# Patient Record
Sex: Female | Born: 1955 | Race: White | Hispanic: No | Smoking: Never smoker
Health system: Southern US, Community
[De-identification: ages and names within clinical notes are randomized; demographics above are authoritative.]

## PROBLEM LIST (undated history)

## (undated) DIAGNOSIS — K449 Diaphragmatic hernia without obstruction or gangrene: Secondary | ICD-10-CM

## (undated) HISTORY — PX: TONSILLECTOMY: SUR1361

## (undated) HISTORY — PX: ABDOMINAL HYSTERECTOMY: SHX81

## (undated) HISTORY — PX: APPENDECTOMY: SHX54

---

## 2003-02-20 ENCOUNTER — Other Ambulatory Visit: Payer: Self-pay

## 2003-12-21 ENCOUNTER — Emergency Department: Payer: Self-pay | Admitting: Emergency Medicine

## 2004-12-31 ENCOUNTER — Other Ambulatory Visit: Payer: Self-pay

## 2004-12-31 ENCOUNTER — Emergency Department: Payer: Self-pay | Admitting: Emergency Medicine

## 2005-06-27 ENCOUNTER — Ambulatory Visit: Payer: Self-pay | Admitting: Unknown Physician Specialty

## 2006-02-03 ENCOUNTER — Emergency Department: Payer: Self-pay | Admitting: Emergency Medicine

## 2006-12-07 ENCOUNTER — Ambulatory Visit: Payer: Self-pay

## 2006-12-21 ENCOUNTER — Ambulatory Visit: Payer: Self-pay | Admitting: Gastroenterology

## 2007-09-09 ENCOUNTER — Emergency Department (HOSPITAL_COMMUNITY): Admission: EM | Admit: 2007-09-09 | Discharge: 2007-09-09 | Payer: Self-pay | Admitting: Emergency Medicine

## 2008-01-08 ENCOUNTER — Ambulatory Visit: Payer: Self-pay

## 2009-05-07 ENCOUNTER — Emergency Department: Payer: Self-pay | Admitting: Emergency Medicine

## 2009-06-09 ENCOUNTER — Ambulatory Visit: Payer: Self-pay | Admitting: Gastroenterology

## 2011-11-01 ENCOUNTER — Encounter (HOSPITAL_COMMUNITY): Payer: Self-pay | Admitting: *Deleted

## 2011-11-01 ENCOUNTER — Emergency Department (HOSPITAL_COMMUNITY)
Admission: EM | Admit: 2011-11-01 | Discharge: 2011-11-01 | Disposition: A | Discharge status: Home / Self Care | Payer: Self-pay | Attending: Emergency Medicine | Admitting: Emergency Medicine

## 2011-11-01 DIAGNOSIS — M545 Low back pain, unspecified: Secondary | ICD-10-CM | POA: Insufficient documentation

## 2011-11-01 LAB — CBC WITH DIFFERENTIAL/PLATELET
Basophils Absolute: 0 10*3/uL (ref 0.0–0.1)
Basophils Relative: 0 % (ref 0–1)
Eosinophils Absolute: 0.4 10*3/uL (ref 0.0–0.7)
HCT: 42.6 % (ref 36.0–46.0)
MCH: 30.2 pg (ref 26.0–34.0)
MCHC: 34 g/dL (ref 30.0–36.0)
Monocytes Absolute: 0.5 10*3/uL (ref 0.1–1.0)
Neutro Abs: 3.9 10*3/uL (ref 1.7–7.7)
Neutrophils Relative %: 57 % (ref 43–77)
RDW: 12.8 % (ref 11.5–15.5)

## 2011-11-01 LAB — COMPREHENSIVE METABOLIC PANEL
AST: 22 U/L (ref 0–37)
Albumin: 4.1 g/dL (ref 3.5–5.2)
Calcium: 9.6 mg/dL (ref 8.4–10.5)
Chloride: 105 mEq/L (ref 96–112)
Creatinine, Ser: 0.8 mg/dL (ref 0.50–1.10)
Total Bilirubin: 0.3 mg/dL (ref 0.3–1.2)
Total Protein: 7.4 g/dL (ref 6.0–8.3)

## 2011-11-01 LAB — URINALYSIS, ROUTINE W REFLEX MICROSCOPIC
Glucose, UA: NEGATIVE mg/dL
Leukocytes, UA: NEGATIVE
Nitrite: NEGATIVE
Specific Gravity, Urine: 1.017 (ref 1.005–1.030)
pH: 5.5 (ref 5.0–8.0)

## 2011-11-01 LAB — LIPASE, BLOOD: Lipase: 30 U/L (ref 11–59)

## 2011-11-01 MED ORDER — OXYCODONE-ACETAMINOPHEN 5-325 MG PO TABS: ORAL_TABLET | ORAL | Status: DC

## 2011-11-01 MED ORDER — KETOROLAC TROMETHAMINE 60 MG/2ML IM SOLN
60.0000 mg | Freq: Once | INTRAMUSCULAR | Status: AC
  Administered 2011-11-01: 60 mg via INTRAMUSCULAR
  Filled 2011-11-01: qty 2

## 2011-11-01 NOTE — ED Notes (Signed)
Pt. A.O. X 4. NAD. Vitals stable. Pain decreased. Given prescriptions. Verbalized understanding in mediations administration and need to follow up. Verbalized understanding of need to return to the ED. No further questions at this time.

## 2011-11-01 NOTE — ED Provider Notes (Signed)
History     CSN: [id number]  Arrival date & time 11/01/11  1751   First MD Initiated Contact with Patient 11/01/11 1915      Chief Complaint  Patient presents with  . Flank Pain    (Consider location/radiation/quality/duration/timing/severity/associated sxs/prior treatment) HPI  56 y.o. female in no acute distress complaining of bilateral lower back pain radiating around to the hip area starting yesterday and significantly worsening today. Pain is 10 out of 10, exacerbated by weightbearing and crossing her legs. It is associated with nausea and diarrhea. Patient denies any fever, history of IV drug abuse or cancer, vomiting, weakness, change in bladder habits, recent trauma, abdominal pain, chest pain shortness of breath.  History reviewed. No pertinent past medical history.  History reviewed. No pertinent past surgical history.  No family history on file.  History  Substance Use Topics  . Smoking status: Never Smoker   . Smokeless tobacco: Not on file  . Alcohol Use: No    OB History    Grav Para Term Preterm Abortions TAB SAB Ect Mult Living                  Review of Systems  Constitutional: Negative for fever.  Respiratory: Negative for shortness of breath.   Cardiovascular: Negative for chest pain.  Gastrointestinal: Positive for diarrhea. Negative for nausea, vomiting and abdominal pain.  Genitourinary: Negative for dysuria, urgency and flank pain.  Musculoskeletal: Positive for back pain.  All other systems reviewed and are negative.    Allergies  Review of patient's allergies indicates no known allergies.  Home Medications   Current Outpatient Rx  Name Route Sig Dispense Refill  . ADULT MULTIVITAMIN W/MINERALS CH Oral Take 1 tablet by mouth daily.      BP 116/60  Pulse 88  Temp 98.1 F (36.7 C) (Oral)  Resp 24  SpO2 97%  Physical Exam  Nursing note and vitals reviewed. Constitutional: She is oriented to person, place, and time. She appears  well-developed and well-nourished. No distress.  HENT:  Head: Normocephalic.  Mouth/Throat: Oropharynx is clear and moist.  Eyes: Conjunctivae normal and EOM are normal. Pupils are equal, round, and reactive to light.  Neck: Normal range of motion.  Cardiovascular: Normal rate, regular rhythm and intact distal pulses.   Pulmonary/Chest: Effort normal and breath sounds normal. No stridor. No respiratory distress. She has no wheezes. She has no rales. She exhibits no tenderness.  Abdominal: Soft. Bowel sounds are normal. She exhibits no distension and no mass. There is no tenderness. There is no rebound and no guarding.  Genitourinary:       CVA tenderness bilaterally  Musculoskeletal: Normal range of motion.       Decreased range of motion in spinal flexion also pain with flexion  Neurological: She is alert and oriented to person, place, and time.       Cranial nerves III through XII intact, strength 5 out of 5x4 extremities, negative pronator drift, finger to nose and heel-to-shin coordinated, sensation intact to pinprick and light touch, gait is coordinated and Romberg is negative.    Skin: Skin is warm.  Psychiatric: She has a normal mood and affect.    ED Course  Procedures (including critical care time)  Labs Reviewed  CBC WITH DIFFERENTIAL - Abnormal; Notable for the following:    Eosinophils Relative 6 (*)     All other components within normal limits  COMPREHENSIVE METABOLIC PANEL - Abnormal; Notable for the following:  GFR calc non Af Amer 81 (*)     All other components within normal limits  URINALYSIS, ROUTINE W REFLEX MICROSCOPIC - Abnormal; Notable for the following:    APPearance CLOUDY (*)     Ketones, ur 15 (*)     All other components within normal limits  LIPASE, BLOOD   No results found.   1. Lumbago       MDM  Patient with bilateral lower back pain exacerbated over the last 24 hours. No red flags. In severe amount of pain, however, Rx options are  limited by the fact the patient will be driving home. I will give her shot of Toradol.  New Prescriptions   OXYCODONE-ACETAMINOPHEN (PERCOCET/ROXICET) 5-325 MG PER TABLET    1 to 2 tabs PO q6hrs  PRN for pain          Wynetta Emery, PA-C 11/01/11 2029

## 2011-11-01 NOTE — ED Notes (Signed)
The pt has had rt flank pain since yesterday.  Nausea with diarrhea

## 2011-11-01 NOTE — ED Notes (Addendum)
Pt reports lower back pain bilaterally. Reports falling on right side approx 4 weeks ago. Pain started yesterday. "Today the pain is so bad it's making me sick on my stomach". Denies burning or pain on urination. Denies N/V. Reports diarrhea x 1 day. Pain 10/10 Radiating to right and left lower abdominal quadrants and buttocks. A.O. X 4. Ambulatory. Respirations even and regular. Vitals stable. NAD.

## 2011-11-02 NOTE — ED Provider Notes (Signed)
Medical screening examination/treatment/procedure(s) were performed by non-physician practitioner and as supervising physician I was immediately available for consultation/collaboration.   Carleene Cooper III, MD 11/02/11 1328

## 2015-02-25 ENCOUNTER — Encounter: Payer: Self-pay | Admitting: *Deleted

## 2015-02-25 ENCOUNTER — Emergency Department: Payer: Self-pay

## 2015-02-25 ENCOUNTER — Emergency Department
Admission: EM | Admit: 2015-02-25 | Discharge: 2015-02-25 | Disposition: A | Discharge status: Home / Self Care | Payer: Self-pay | Attending: Emergency Medicine | Admitting: Emergency Medicine

## 2015-02-25 DIAGNOSIS — M549 Dorsalgia, unspecified: Secondary | ICD-10-CM | POA: Insufficient documentation

## 2015-02-25 DIAGNOSIS — Z79899 Other long term (current) drug therapy: Secondary | ICD-10-CM | POA: Insufficient documentation

## 2015-02-25 DIAGNOSIS — J9811 Atelectasis: Secondary | ICD-10-CM | POA: Insufficient documentation

## 2015-02-25 LAB — POCT RAPID STREP A: STREPTOCOCCUS, GROUP A SCREEN (DIRECT): NEGATIVE

## 2015-02-25 MED ORDER — IBUPROFEN 800 MG PO TABS: 800.0000 mg | ORAL_TABLET | Freq: Three times a day (TID) | ORAL | Status: DC | PRN

## 2015-02-25 MED ORDER — GUAIFENESIN-CODEINE 100-10 MG/5ML PO SOLN: 10.0000 mL | ORAL | Status: DC | PRN

## 2015-02-25 MED ORDER — AZITHROMYCIN 250 MG PO TABS: ORAL_TABLET | ORAL | Status: DC

## 2015-02-25 NOTE — ED Notes (Signed)
Pt reports she has bodyaches, chills, intermittent fevers, and cough.  Sx for 2 days.

## 2015-02-25 NOTE — ED Provider Notes (Signed)
[facility] Emergency Department Provider Note  ____________________________________________  Time seen: Approximately 3:37 PM  I have reviewed the triage vital signs and the nursing notes.   HISTORY  Chief Complaint URI   HPI Kelsey Marquez is a 60 y.o. female who presents to the ER with complaints of "flu" symptoms. Reports fever, chills, chest congestion/tightness, backache and sore throat x 1.5 days. No relief with OTC Goodies or other medication. Reports occasional cough with non-productive sputum.   No past medical history on file.  There are no active problems to display for this patient.   No past surgical history on file.  Current Outpatient Rx  Name  Route  Sig  Dispense  Refill  . azithromycin (ZITHROMAX Z-PAK) 250 MG tablet      Take 2 tablets (500 mg) on  Day 1,  followed by 1 tablet (250 mg) once daily on Days 2 through 5.   6 each   0   . guaiFENesin-codeine 100-10 MG/5ML syrup   Oral   Take 10 mLs by mouth every 4 (four) hours as needed for cough.   180 mL   0   . ibuprofen (ADVIL,MOTRIN) 800 MG tablet   Oral   Take 1 tablet (800 mg total) by mouth every 8 (eight) hours as needed.   30 tablet   0   . Multiple Vitamin (MULTIVITAMIN WITH MINERALS) TABS   Oral   Take 1 tablet by mouth daily.         Marland Kitchen oxyCODONE-acetaminophen (PERCOCET/ROXICET) 5-325 MG per tablet      1 to 2 tabs PO q6hrs  PRN for pain   15 tablet   0     Allergies Review of patient's allergies indicates no known allergies.  No family history on file.  Social History Social History  Substance Use Topics  . Smoking status: Never Smoker   . Smokeless tobacco: None  . Alcohol Use: No    Review of Systems Constitutional: Positive fever/chills Eyes: No visual changes. ENT: Mild sore throat. Cardiovascular: Positive chest pain with tighness. Respiratory: Occasional shortness of breath. Gastrointestinal: No abdominal pain.  No nausea, no vomiting.   No diarrhea.  No constipation. Genitourinary: Negative for dysuria. Musculoskeletal: Positive for back pain. Skin: Negative for rash. Neurological: Negative for headaches, focal weakness or numbness.  10-point ROS otherwise negative.  ____________________________________________   PHYSICAL EXAM:  VITAL SIGNS: ED Triage Vitals  Enc Vitals Group     BP 02/25/15 1506 159/82 mmHg     Pulse Rate 02/25/15 1506 95     Resp 02/25/15 1506 20     Temp 02/25/15 1506 98.4 F (36.9 C)     Temp Source 02/25/15 1506 Oral     SpO2 02/25/15 1506 99 %     Weight 02/25/15 1506 165 lb (74.844 kg)     Height 02/25/15 1506  (1.6 m)     Head Cir --      Peak Flow --      Pain Score 02/25/15 1507 9     Pain Loc --      Pain Edu? --      Excl. in GC? --     Constitutional: Alert and oriented. Well appearing and in no acute distress. Eyes: Conjunctivae are normal. PERRL. EOMI. Head: Atraumatic. Nose: No congestion/rhinnorhea. Mouth/Throat: Mucous membranes are moist.  Oropharynx non-erythematous. Neck: No stridor.   Cardiovascular: Normal rate, regular rhythm. Grossly normal heart sounds.  Good peripheral circulation. Respiratory: Normal respiratory  effort.  No retractions. Lungs CTAB. Musculoskeletal: No lower extremity tenderness nor edema.  No joint effusions. Neurologic:  Normal speech and language. No gross focal neurologic deficits are appreciated. No gait instability. Skin:  Skin is warm, dry and intact. No rash noted. Psychiatric: Mood and affect are normal. Speech and behavior are normal.  ____________________________________________   LABS (all labs ordered are listed, but only abnormal results are displayed)  Labs Reviewed  CULTURE, GROUP A STREP ([facility])  INFLUENZA PANEL BY PCR (TYPE A & B, H1N1)  POCT RAPID STREP A    RADIOLOGY  Atelectasis right middle lobe. ____________________________________________   PROCEDURES  Procedure(s) performed: None  Critical  Care performed: No  ____________________________________________   INITIAL IMPRESSION / ASSESSMENT AND PLAN / ED COURSE  Pertinent labs & imaging results that were available during my care of the patient were reviewed by me and considered in my medical decision making (see chart for details).  Acute upper respiratory infection with atelectasis. Rx given for Zithromax, Motrin 800, Robitussin-AC. Patient follow-up with PCP or return to the ER with any worsening symptomology. Work excuse given 48 hours.  Patient voices no other emergency medical complaints at this time. ____________________________________________   FINAL CLINICAL IMPRESSION(S) / ED DIAGNOSES  Final diagnoses:  Atelectasis, right     Evangeline Dakin, PA-C 02/25/15 1658  Governor Rooks, MD 02/25/15 1745

## 2015-02-25 NOTE — Discharge Instructions (Signed)
Atelectasis, Adult °Atelectasis is a collapse of the small air sacs in the lungs (alveoli). When this occurs, all or part of a lung collapses and becomes airless. It can be caused by various things and is a common problem after surgery. The severity of atelectasis will vary depending on the size of the area involved and the underlying cause of the condition. °CAUSES  °There are multiple causes for atelectasis:  °· Shallow breathing, particularly if there is an injury to your chest wall or abdomen that makes it painful to take a deep breath. This commonly occurs after surgery. °· Obstruction of your airways (bronchi or bronchioles). This may be caused by a buildup of mucus (mucus plug), tumors, blood clots (pulmonary embolus), or inhaled foreign bodies. Mucus plugs occur when the lungs do not expand enough to get rid of mucus. °· Outside pressure on the lung. This may be caused by tumors, fluid (pleural effusion), or a leakage of air between the lung and rib cage (pneumothorax).   °· Infections such as pneumonia. °· Scarring in lung tissue left over from previous infection or injury. °· Some diseases such as cystic fibrosis. °SIGNS AND SYMPTOMS  °Often, atelectasis will have no symptoms. When symptoms occur, they include: °· Shortness of breath.   °· Bluish color to your nails, lips, or mouth (cyanosis). °DIAGNOSIS  °Your health care provider may suspect atelectasis based on symptoms and physical findings. A chest X-ray may be done to confirm the diagnosis. More specialized X-ray exams are sometimes required.  °TREATMENT  °Treatment will depend on the cause of the atelectasis. Treatment may include: °· Purposeful coughing to loosen mucus plugs in the lungs. °· Chest physiotherapy. This consists of clapping or percussion on the chest over the lungs to further loosen mucus plugs. °· Postural drainage techniques. This involves positioning your body so your head is lower than your chest. °HOME CARE  INSTRUCTIONS °· Practice relaxed deep breathing whenever you are sitting down. A good technique is to take a few relaxed deep breaths each time a commercial comes on if you are watching television. °· If you were given a deep breathing device (such as an incentive spirometer) or a mucus clearance device, use this regularly as directed by your health care provider. °· Try to cough several times a day as directed by your health care provider. °· Perform any chest physiotherapy or postural drainage techniques as directed by your health care provider. If necessary, have someone (such as a family member) assist you with these techniques. °· When lying down, lie on the unaffected side to encourage mucus drainage. °· Stay physically active as much as possible. °SEEK IMMEDIATE MEDICAL CARE IF:  °· You develop increasing problems with your breathing.   °· You develop severe chest pain.   °· You develop severe coughing, or you cough up blood.   °· You have a fever or persistent symptoms for more than 2-3 days.   °· You have a fever and your symptoms suddenly get worse.   °MAKE SURE YOU: °· Understand these instructions. °· Will watch your condition. °· Will get help right away if you are not doing well or get worse. °  °This information is not intended to replace advice given to you by your health care provider. Make sure you discuss any questions you have with your health care provider. °  °Document Released: 01/17/2005 Document Revised: 02/07/2014 Document Reviewed: 07/25/2012 °Elsevier Interactive Patient Education ©2016 [employer] ° °

## 2015-02-27 LAB — CULTURE, GROUP A STREP (THRC)

## 2015-03-19 ENCOUNTER — Encounter: Payer: Self-pay | Admitting: *Deleted

## 2015-03-19 ENCOUNTER — Emergency Department: Payer: Self-pay

## 2015-03-19 ENCOUNTER — Emergency Department
Admission: EM | Admit: 2015-03-19 | Discharge: 2015-03-19 | Disposition: A | Discharge status: Home / Self Care | Payer: Self-pay | Attending: Student | Admitting: Student

## 2015-03-19 DIAGNOSIS — Z792 Long term (current) use of antibiotics: Secondary | ICD-10-CM | POA: Insufficient documentation

## 2015-03-19 DIAGNOSIS — R1013 Epigastric pain: Secondary | ICD-10-CM | POA: Insufficient documentation

## 2015-03-19 DIAGNOSIS — R1011 Right upper quadrant pain: Secondary | ICD-10-CM | POA: Insufficient documentation

## 2015-03-19 DIAGNOSIS — R112 Nausea with vomiting, unspecified: Secondary | ICD-10-CM | POA: Insufficient documentation

## 2015-03-19 DIAGNOSIS — R52 Pain, unspecified: Secondary | ICD-10-CM

## 2015-03-19 DIAGNOSIS — R197 Diarrhea, unspecified: Secondary | ICD-10-CM | POA: Insufficient documentation

## 2015-03-19 DIAGNOSIS — Z79899 Other long term (current) drug therapy: Secondary | ICD-10-CM | POA: Insufficient documentation

## 2015-03-19 HISTORY — DX: Diaphragmatic hernia without obstruction or gangrene: K44.9

## 2015-03-19 LAB — COMPREHENSIVE METABOLIC PANEL
ALT: 32 U/L (ref 14–54)
AST: 30 U/L (ref 15–41)
Albumin: 4 g/dL (ref 3.5–5.0)
Alkaline Phosphatase: 93 U/L (ref 38–126)
Anion gap: 6 (ref 5–15)
BUN: 12 mg/dL (ref 6–20)
CO2: 25 mmol/L (ref 22–32)
Calcium: 8.4 mg/dL — ABNORMAL LOW (ref 8.9–10.3)
Chloride: 107 mmol/L (ref 101–111)
Creatinine, Ser: 0.73 mg/dL (ref 0.44–1.00)
GFR calc Af Amer: >60 mL/min (ref >60)
GFR calc non Af Amer: >60 mL/min (ref >60)
Glucose, Bld: 107 mg/dL — ABNORMAL HIGH (ref 65–99)
Potassium: 3.6 mmol/L (ref 3.5–5.1)
Sodium: 138 mmol/L (ref 135–145)
Total Bilirubin: 0.7 mg/dL (ref 0.3–1.2)
Total Protein: 6.9 g/dL (ref 6.5–8.1)

## 2015-03-19 LAB — URINALYSIS COMPLETE WITH MICROSCOPIC (ARMC ONLY)
BILIRUBIN URINE: NEGATIVE
Bacteria, UA: NONE SEEN
GLUCOSE, UA: NEGATIVE mg/dL
HGB URINE DIPSTICK: NEGATIVE
LEUKOCYTES UA: NEGATIVE
NITRITE: NEGATIVE
PH: 5 (ref 5.0–8.0)
Protein, ur: NEGATIVE mg/dL
RBC / HPF: NONE SEEN RBC/hpf (ref 0–5)
SPECIFIC GRAVITY, URINE: 1.025 (ref 1.005–1.030)

## 2015-03-19 LAB — CBC
HEMATOCRIT: 42.7 % (ref 35.0–47.0)
HEMOGLOBIN: 14.9 g/dL (ref 12.0–16.0)
MCH: 30.5 pg (ref 26.0–34.0)
MCHC: 34.7 g/dL (ref 32.0–36.0)
MCV: 87.9 fL (ref 80.0–100.0)
Platelets: 242 10*3/uL (ref 150–440)
RBC: 4.87 MIL/uL (ref 3.80–5.20)
RDW: 13.1 % (ref 11.5–14.5)
WBC: 4.6 10*3/uL (ref 3.6–11.0)

## 2015-03-19 LAB — LIPASE, BLOOD: Lipase: 26 U/L (ref 11–51)

## 2015-03-19 MED ORDER — ONDANSETRON 4 MG PO TBDP
4.0000 mg | ORAL_TABLET | Freq: Once | ORAL | Status: AC
  Administered 2015-03-19: 4 mg via ORAL

## 2015-03-19 MED ORDER — ONDANSETRON 4 MG PO TBDP: 4.0000 mg | ORAL_TABLET | Freq: Once | ORAL | Status: DC

## 2015-03-19 MED ORDER — OXYCODONE HCL 5 MG PO TABS: 5.0000 mg | ORAL_TABLET | Freq: Once | ORAL | Status: DC

## 2015-03-19 MED ORDER — ONDANSETRON 4 MG PO TBDP: 4.0000 mg | ORAL_TABLET | Freq: Three times a day (TID) | ORAL | Status: DC | PRN

## 2015-03-19 MED ORDER — OXYCODONE HCL 5 MG PO TABS
ORAL_TABLET | ORAL | Status: AC
  Administered 2015-03-19: 5 mg via ORAL
  Filled 2015-03-19: qty 1

## 2015-03-19 MED ORDER — OXYCODONE HCL 5 MG PO TABS
5.0000 mg | ORAL_TABLET | Freq: Once | ORAL | Status: AC
  Administered 2015-03-19: 5 mg via ORAL

## 2015-03-19 MED ORDER — ONDANSETRON 4 MG PO TBDP
ORAL_TABLET | ORAL | Status: AC
  Administered 2015-03-19: 4 mg via ORAL
  Filled 2015-03-19: qty 1

## 2015-03-19 NOTE — ED Provider Notes (Signed)
[facility] Emergency Department Provider Note  ____________________________________________  Time seen: Approximately 2:05 PM  I have reviewed the triage vital signs and the nursing notes.   HISTORY  Chief Complaint Abdominal Pain    HPI Kelsey Marquez is a 60 y.o. female history of hiatal hernia who presents for evaluation of epigastric pain that began yesterday, gradual onset, constant since onset, described as burning, no modifying factors she has also had at least 10 episodes of nonbloody nonbilious emesis at least 5 episodes of nonbloody diarrhea. No fevers or chills. No chest pain or difficulty breathing. She reports her symptoms are worse with eating. She now has a headache which she reports is moderate in severity.   Past Medical History  Diagnosis Date  . Hiatal hernia     There are no active problems to display for this patient.   History reviewed. No pertinent past surgical history.  Current Outpatient Rx  Name  Route  Sig  Dispense  Refill  . azithromycin (ZITHROMAX Z-PAK) 250 MG tablet      Take 2 tablets (500 mg) on  Day 1,  followed by 1 tablet (250 mg) once daily on Days 2 through 5.   6 each   0   . guaiFENesin-codeine 100-10 MG/5ML syrup   Oral   Take 10 mLs by mouth every 4 (four) hours as needed for cough.   180 mL   0   . ibuprofen (ADVIL,MOTRIN) 800 MG tablet   Oral   Take 1 tablet (800 mg total) by mouth every 8 (eight) hours as needed.   30 tablet   0   . Multiple Vitamin (MULTIVITAMIN WITH MINERALS) TABS   Oral   Take 1 tablet by mouth daily.         Marland Kitchen oxyCODONE-acetaminophen (PERCOCET/ROXICET) 5-325 MG per tablet      1 to 2 tabs PO q6hrs  PRN for pain   15 tablet   0     Allergies Review of patient's allergies indicates no known allergies.  History reviewed. No pertinent family history.  Social History Social History  Substance Use Topics  . Smoking status: Never Smoker   . Smokeless tobacco: None   . Alcohol Use: No    Review of Systems Constitutional: No fever/chills Eyes: No visual changes. ENT: No sore throat. Cardiovascular: Denies chest pain. Respiratory: Denies shortness of breath. Gastrointestinal: + abdominal pain.  + nausea, + vomiting.  + diarrhea.  No constipation. Genitourinary: Negative for dysuria. Musculoskeletal: Negative for back pain. Skin: Negative for rash. Neurological: Negative for headaches, focal weakness or numbness.  10-point ROS otherwise negative.  ____________________________________________   PHYSICAL EXAM:  VITAL SIGNS: ED Triage Vitals  Enc Vitals Group     BP 03/19/15 1148 141/67 mmHg     Pulse Rate 03/19/15 1148 90     Resp 03/19/15 1148 18     Temp 03/19/15 1148 98.2 F (36.8 C)     Temp Source 03/19/15 1148 Oral     SpO2 03/19/15 1148 97 %     Weight 03/19/15 1148 155 lb (70.308 kg)     Height 03/19/15 1148 5\' 3"  (1.6 m)     Head Cir --      Peak Flow --      Pain Score 03/19/15 1149 10     Pain Loc --      Pain Edu? --      Excl. in GC? --     Constitutional: Alert and oriented.  Well appearing and in no acute distress. Eyes: Conjunctivae are normal. PERRL. EOMI. Head: Atraumatic. Nose: No congestion/rhinnorhea. Mouth/Throat: Mucous membranes are moist.  Oropharynx non-erythematous. Neck: No stridor.  Cardiovascular: Normal rate, regular rhythm. Grossly normal heart sounds.  Good peripheral circulation. Respiratory: Normal respiratory effort.  No retractions. Lungs CTAB. Gastrointestinal: Normal bowel sounds. Soft with mild tenderness in the epigastrium and faint tenderness in the right upper quadrant. No rebound or guarding. No CVA tenderness. Genitourinary: Deferred Musculoskeletal: No lower extremity tenderness nor edema.  No joint effusions. Neurologic:  Normal speech and language. No gross focal neurologic deficits are appreciated. No gait instability. Skin:  Skin is warm, dry and intact. No rash  noted. Psychiatric: Mood and affect are normal. Speech and behavior are normal.  ____________________________________________   LABS (all labs ordered are listed, but only abnormal results are displayed)  Labs Reviewed  COMPREHENSIVE METABOLIC PANEL - Abnormal; Notable for the following:    Glucose, Bld 107 (*)    Calcium 8.4 (*)    All other components within normal limits  URINALYSIS COMPLETEWITH MICROSCOPIC ([facility] ONLY) - Abnormal; Notable for the following:    Color, Urine YELLOW (*)    APPearance CLOUDY (*)    Ketones, ur TRACE (*)    Squamous Epithelial / LPF TOO NUMEROUS TO COUNT (*)    All other components within normal limits  LIPASE, BLOOD  CBC   ____________________________________________  EKG  ED ECG REPORT I, Gayla Doss, the attending physician, personally viewed and interpreted this ECG.   Date: 03/19/2015  EKG Time: 11:51  Rate: 81  Rhythm: normal sinus rhythm  Axis: normal  Intervals:none  ST&T Change: No acute ST elevation. Q waves in lead 3 as well as V2, V3.  ____________________________________________  RADIOLOGY  RUQ ultrasound IMPRESSION: No abnormality seen in the right upper quadrant the abdomen.  ____________________________________________   PROCEDURES  Procedure(s) performed: None  Critical Care performed: No  ____________________________________________   INITIAL IMPRESSION / ASSESSMENT AND PLAN / ED COURSE  Pertinent labs & imaging results that were available during my care of the patient were reviewed by me and considered in my medical decision making (see chart for details).  Kelsey Marquez is a 60 y.o. female history of hiatal hernia who presents for evaluation of epigastric pain that began yesterday, gradual onset, constant since onset, described as burning, associated with many episodes of vomiting and diarrhea. On exam, she is nontoxic appearing and in no acute distress. Vital signs stable, she is afebrile. She does  have some tenderness to palpation in the epigastrium and the right upper quadrant. Labs reviewed. CBC, CMP, lipase and urinalysis are generally unremarkable. Discussed with her that her symptoms may be related to viral syndrome however given her tenderness on exam will obtain right upper quadrant ultrasound to evaluate for acute gall bladder pathology. We'll treat her pain and nausea. Reassess for disposition. EKG shows no acute ischemia and I doubt that this represents atypical ACS. Symptoms are nonexertional, she has no chest pain, no shortness of breath, no diaphoresis.  ----------------------------------------- 3:58 PM on 03/19/2015 -----------------------------------------  Analysis not consistent with infection. Right upper quadrant ultrasound is negative. The patient reports improvement of her abdominal pain and nausea at this time. She is tolerating by mouth intake. We discussed return precautions, need for close PCP follow-up, symptomatic treatment and she is comfortable with the discharge plan. DC home.   ____________________________________________   FINAL CLINICAL IMPRESSION(S) / ED DIAGNOSES  Final diagnoses:  Pain  Acute epigastric  pain  Nausea vomiting and diarrhea      Gayla Doss, MD 03/19/15 [phone]

## 2015-03-19 NOTE — ED Notes (Signed)
MD at bedside to assess patient.  (Dr. Gayle) 

## 2015-03-19 NOTE — ED Notes (Addendum)
States upper abd pain since yesterday at 5 pm, states pain increases with eating, states headache, states vomiting, states she has a hiatal hernia

## 2015-03-26 ENCOUNTER — Emergency Department: Payer: BLUE CROSS/BLUE SHIELD

## 2015-03-26 ENCOUNTER — Encounter: Payer: Self-pay | Admitting: Emergency Medicine

## 2015-03-26 ENCOUNTER — Emergency Department
Admission: EM | Admit: 2015-03-26 | Discharge: 2015-03-26 | Disposition: A | Discharge status: Home / Self Care | Payer: BLUE CROSS/BLUE SHIELD | Attending: Emergency Medicine | Admitting: Emergency Medicine

## 2015-03-26 DIAGNOSIS — J069 Acute upper respiratory infection, unspecified: Secondary | ICD-10-CM | POA: Insufficient documentation

## 2015-03-26 DIAGNOSIS — R079 Chest pain, unspecified: Secondary | ICD-10-CM | POA: Insufficient documentation

## 2015-03-26 DIAGNOSIS — R05 Cough: Secondary | ICD-10-CM | POA: Diagnosis present

## 2015-03-26 LAB — CBC WITH DIFFERENTIAL/PLATELET
BASOS ABS: 0 10*3/uL (ref 0–0.1)
BASOS PCT: 1 %
EOS ABS: 0.1 10*3/uL (ref 0–0.7)
Eosinophils Relative: 3 %
HEMATOCRIT: 42.9 % (ref 35.0–47.0)
HEMOGLOBIN: 14.5 g/dL (ref 12.0–16.0)
Lymphocytes Relative: 13 %
Lymphs Abs: 0.5 10*3/uL — ABNORMAL LOW (ref 1.0–3.6)
MCH: 29.7 pg (ref 26.0–34.0)
MCHC: 33.9 g/dL (ref 32.0–36.0)
MCV: 87.7 fL (ref 80.0–100.0)
Monocytes Absolute: 0.6 10*3/uL (ref 0.2–0.9)
Monocytes Relative: 15 %
NEUTROS ABS: 2.8 10*3/uL (ref 1.4–6.5)
NEUTROS PCT: 68 %
Platelets: 271 10*3/uL (ref 150–440)
RBC: 4.89 MIL/uL (ref 3.80–5.20)
RDW: 13.3 % (ref 11.5–14.5)
WBC: 4.2 10*3/uL (ref 3.6–11.0)

## 2015-03-26 LAB — COMPREHENSIVE METABOLIC PANEL
ALK PHOS: 108 U/L (ref 38–126)
ALT: 45 U/L (ref 14–54)
ANION GAP: 7 (ref 5–15)
AST: 40 U/L (ref 15–41)
Albumin: 4.1 g/dL (ref 3.5–5.0)
BILIRUBIN TOTAL: 0.7 mg/dL (ref 0.3–1.2)
BUN: 10 mg/dL (ref 6–20)
CALCIUM: 8.6 mg/dL — AB (ref 8.9–10.3)
CO2: 28 mmol/L (ref 22–32)
Chloride: 104 mmol/L (ref 101–111)
Creatinine, Ser: 0.71 mg/dL (ref 0.44–1.00)
GFR calc Af Amer: >60 mL/min (ref >60)
GFR calc non Af Amer: >60 mL/min (ref >60)
GLUCOSE: 96 mg/dL (ref 65–99)
Potassium: 4 mmol/L (ref 3.5–5.1)
SODIUM: 139 mmol/L (ref 135–145)
Total Protein: 7 g/dL (ref 6.5–8.1)

## 2015-03-26 MED ORDER — HYDROCOD POLST-CPM POLST ER 10-8 MG/5ML PO SUER: 5.0000 mL | Freq: Two times a day (BID) | ORAL | Status: DC

## 2015-03-26 MED ORDER — IOHEXOL 350 MG/ML SOLN
100.0000 mL | Freq: Once | INTRAVENOUS | Status: AC | PRN
  Administered 2015-03-26: 100 mL via INTRAVENOUS
  Filled 2015-03-26: qty 100

## 2015-03-26 NOTE — ED Provider Notes (Signed)
[facility] Emergency Department Provider Note     Time seen: ----------------------------------------- 4:25 PM on 03/26/2015 -----------------------------------------    I have reviewed the triage vital signs and the nursing notes.   HISTORY  Chief Complaint Cough    HPI Kelsey Marquez is a 60 y.o. female who presents ER for 3 weeks of right-sided chest pain with headache. Patient states she's had recent cough congestion but the right-sided chest pain predates these symptoms. She states it hurts some when she breathes, has not had a fever, nausea vomiting or diarrhea at this time. This is her third visit here for same.   Past Medical History  Diagnosis Date  . Hiatal hernia     There are no active problems to display for this patient.   History reviewed. No pertinent past surgical history.  Allergies Review of patient's allergies indicates no known allergies.  Social History Social History  Substance Use Topics  . Smoking status: Never Smoker   . Smokeless tobacco: None  . Alcohol Use: No    Review of Systems Constitutional: Negative for fever. Eyes: Negative for visual changes. ENT: Negative for sore throat. Cardiovascular: Positive for chest pain Respiratory: Positive for difficulty breathing and cough Gastrointestinal: Negative for abdominal pain, vomiting and diarrhea. Genitourinary: Negative for dysuria. Musculoskeletal: Negative for back pain. Skin: Negative for rash. Neurological: Negative for headaches, focal weakness or numbness.  10-point ROS otherwise negative.  ____________________________________________   PHYSICAL EXAM:  VITAL SIGNS: ED Triage Vitals  Enc Vitals Group     BP 03/26/15 1500 131/78 mmHg     Pulse Rate 03/26/15 1500 99     Resp 03/26/15 1500 18     Temp 03/26/15 1500 98.2 F (36.8 C)     Temp Source 03/26/15 1500 Oral     SpO2 03/26/15 1500 95 %     Weight 03/26/15 1500 155 lb (70.308 kg)   Height 03/26/15 1500  (1.6 m)     Head Cir --      Peak Flow --      Pain Score 03/26/15 1500 10     Pain Loc --      Pain Edu? --      Excl. in GC? --     Constitutional: Alert and oriented. Well appearing and in no distress. Eyes: Conjunctivae are normal. PERRL. Normal extraocular movements. ENT   Head: Normocephalic and atraumatic.   Nose: No congestion/rhinnorhea.   Mouth/Throat: Mucous membranes are moist.   Neck: No stridor. Cardiovascular: Normal rate, regular rhythm. Normal and symmetric distal pulses are present in all extremities. No murmurs, rubs, or gallops. Respiratory: Normal respiratory effort without tachypnea nor retractions. Breath sounds are clear and equal bilaterally. No wheezes/rales/rhonchi. Gastrointestinal: Soft and nontender. No distention. No abdominal bruits.  Musculoskeletal: Nontender with normal range of motion in all extremities. No joint effusions.  No lower extremity tenderness nor edema. Neurologic:  Normal speech and language. No gross focal neurologic deficits are appreciated. Speech is normal. No gait instability. Skin:  Skin is warm, dry and intact. No rash noted. Psychiatric: Mood and affect are normal. Speech and behavior are normal. Patient exhibits appropriate insight and judgment. ____________________________________________  EKG: Interpreted by me. Normal sinus rhythm with a rate of 95 bpm, normal PR interval, normal QRS, normal QT interval. Normal axis.  ____________________________________________  ED COURSE:  Pertinent labs & imaging results that were available during my care of the patient were reviewed by me and considered in my medical decision making (  see chart for details). Patient's distress, will consider CT angiogram due to persistence of pain. ____________________________________________    LABS (pertinent positives/negatives)  Labs Reviewed  CBC WITH DIFFERENTIAL/PLATELET - Abnormal; Notable for the  following:    Lymphs Abs 0.5 (*)    All other components within normal limits  COMPREHENSIVE METABOLIC PANEL       Lymphs Abs 0.5 (*)    All other components within normal limits  COMPREHENSIVE METABOLIC PANEL    RADIOLOGY  CT angiogram of the chest IMPRESSION: No pulmonary embolus. No acute abnormality identified in the chest.  Stable small nodule in the right upper lobe unchanged compared to prior CT of 2006. Was not 2 year stability has been established. No follow-up is necessary. ____________________________________________  FINAL ASSESSMENT AND PLAN  Chest pain, URI  Plan: Patient with labs and imaging as dictated above. Obtain CT imaging to ensure she did not have occult infection, mass or blood clot. Symptoms are likely viral and intercostal muscle related. She'll be discharged with cough medications and follow-up with her doctor.   Emily Filbert, MD   Emily Filbert, MD 03/26/15 [id number]

## 2015-03-26 NOTE — ED Notes (Signed)
Reports cough, congestion, sore throat x 3 wks.  Has been seen here x3 for the same.  No resp distress

## 2015-03-26 NOTE — ED Notes (Signed)
Previous note by Ursula Alert RN

## 2015-03-26 NOTE — Discharge Instructions (Signed)
Chest Wall Pain °Chest wall pain is pain in or around the bones and muscles of your chest. Sometimes, an injury causes this pain. Sometimes, the cause may not be known. This pain may take several weeks or longer to get better. °HOME CARE INSTRUCTIONS  °Pay attention to any changes in your symptoms. Take these actions to help with your pain:  °· Rest as told by your health care provider.   °· Avoid activities that cause pain. These include any activities that use your chest muscles or your abdominal and side muscles to lift heavy items.    °· If directed, apply ice to the painful area: °· Put ice in a plastic bag. °· Place a towel between your skin and the bag. °· Leave the ice on for 20 minutes, 2-3 times per day. °· Take over-the-counter and prescription medicines only as told by your health care provider. °· Do not use tobacco products, including cigarettes, chewing tobacco, and e-cigarettes. If you need help quitting, ask your health care provider. °· Keep all follow-up visits as told by your health care provider. This is important. °SEEK MEDICAL CARE IF: °· You have a fever. °· Your chest pain becomes worse. °· You have new symptoms. °SEEK IMMEDIATE MEDICAL CARE IF: °· You have nausea or vomiting. °· You feel sweaty or light-headed. °· You have a cough with phlegm (sputum) or you cough up blood. °· You develop shortness of breath. °  °This information is not intended to replace advice given to you by your health care provider. Make sure you discuss any questions you have with your health care provider. °  °Document Released: 01/17/2005 Document Revised: 10/08/2014 Document Reviewed: 04/14/2014 °Elsevier Interactive Patient Education ©2016 [employer] ° °Cough, Adult °Coughing is a reflex that clears your throat and your airways. Coughing helps to heal and protect your lungs. It is normal to cough occasionally, but a cough that happens with other symptoms or lasts a long time may be a sign of a condition that  needs treatment. A cough may last only 2-3 weeks (acute), or it may last longer than 8 weeks (chronic). °CAUSES °Coughing is commonly caused by: °· Breathing in substances that irritate your lungs. °· A viral or bacterial respiratory infection. °· Allergies. °· Asthma. °· Postnasal drip. °· Smoking. °· Acid backing up from the stomach into the esophagus (gastroesophageal reflux). °· Certain medicines. °· Chronic lung problems, including COPD (or rarely, lung cancer). °· Other medical conditions such as heart failure. °HOME CARE INSTRUCTIONS  °Pay attention to any changes in your symptoms. Take these actions to help with your discomfort: °· Take medicines only as told by your health care provider. °¨ If you were prescribed an antibiotic medicine, take it as told by your health care provider. Do not stop taking the antibiotic even if you start to feel better. °¨ Talk with your health care provider before you take a cough suppressant medicine. °· Drink enough fluid to keep your urine clear or pale yellow. °· If the air is dry, use a cold steam vaporizer or humidifier in your bedroom or your home to help loosen secretions. °· Avoid anything that causes you to cough at work or at home. °· If your cough is worse at night, try sleeping in a semi-upright position. °· Avoid cigarette smoke. If you smoke, quit smoking. If you need help quitting, ask your health care provider. °· Avoid caffeine. °· Avoid alcohol. °· Rest as needed. °SEEK MEDICAL CARE IF:  °· You have new   symptoms. °· You cough up pus. °· Your cough does not get better after 2-3 weeks, or your cough gets worse. °· You cannot control your cough with suppressant medicines and you are losing sleep. °· You develop pain that is getting worse or pain that is not controlled with pain medicines. °· You have a fever. °· You have unexplained weight loss. °· You have night sweats. °SEEK IMMEDIATE MEDICAL CARE IF: °· You cough up blood. °· You have difficulty  breathing. °· Your heartbeat is very fast. °  °This information is not intended to replace advice given to you by your health care provider. Make sure you discuss any questions you have with your health care provider. °  °Document Released: 07/16/2010 Document Revised: 10/08/2014 Document Reviewed: 03/26/2014 °Elsevier Interactive Patient Education ©2016 Elsevier Inc. ° °

## 2015-03-26 NOTE — ED Notes (Signed)
C/o right chest pain X 3 weeks and headache. Has recently started with cough and congestion. Denies fevers or NVD at this time

## 2015-07-01 ENCOUNTER — Emergency Department (HOSPITAL_COMMUNITY)
Admission: EM | Admit: 2015-07-01 | Discharge: 2015-07-01 | Disposition: A | Discharge status: Home / Self Care | Payer: BLUE CROSS/BLUE SHIELD | Attending: Emergency Medicine | Admitting: Emergency Medicine

## 2015-07-01 ENCOUNTER — Emergency Department (HOSPITAL_COMMUNITY): Payer: BLUE CROSS/BLUE SHIELD

## 2015-07-01 ENCOUNTER — Encounter (HOSPITAL_COMMUNITY): Payer: Self-pay

## 2015-07-01 DIAGNOSIS — S29002A Unspecified injury of muscle and tendon of back wall of thorax, initial encounter: Secondary | ICD-10-CM | POA: Diagnosis present

## 2015-07-01 DIAGNOSIS — Z8719 Personal history of other diseases of the digestive system: Secondary | ICD-10-CM | POA: Diagnosis not present

## 2015-07-01 DIAGNOSIS — Y9389 Activity, other specified: Secondary | ICD-10-CM | POA: Insufficient documentation

## 2015-07-01 DIAGNOSIS — S3992XA Unspecified injury of lower back, initial encounter: Secondary | ICD-10-CM

## 2015-07-01 DIAGNOSIS — T148XXA Other injury of unspecified body region, initial encounter: Secondary | ICD-10-CM

## 2015-07-01 DIAGNOSIS — S300XXA Contusion of lower back and pelvis, initial encounter: Secondary | ICD-10-CM | POA: Insufficient documentation

## 2015-07-01 DIAGNOSIS — W01198A Fall on same level from slipping, tripping and stumbling with subsequent striking against other object, initial encounter: Secondary | ICD-10-CM | POA: Insufficient documentation

## 2015-07-01 DIAGNOSIS — Y998 Other external cause status: Secondary | ICD-10-CM | POA: Diagnosis not present

## 2015-07-01 DIAGNOSIS — Y92002 Bathroom of unspecified non-institutional (private) residence single-family (private) house as the place of occurrence of the external cause: Secondary | ICD-10-CM | POA: Diagnosis not present

## 2015-07-01 LAB — URINALYSIS, ROUTINE W REFLEX MICROSCOPIC
Bilirubin Urine: NEGATIVE
Glucose, UA: NEGATIVE mg/dL
Hgb urine dipstick: NEGATIVE
Ketones, ur: 15 mg/dL — AB
Leukocytes, UA: NEGATIVE
Nitrite: NEGATIVE
Protein, ur: NEGATIVE mg/dL
Specific Gravity, Urine: 1.025 (ref 1.005–1.030)
pH: 5.5 (ref 5.0–8.0)

## 2015-07-01 MED ORDER — NAPROXEN 375 MG PO TABS: 375.0000 mg | ORAL_TABLET | Freq: Two times a day (BID) | ORAL | Status: AC

## 2015-07-01 MED ORDER — MORPHINE SULFATE 30 MG PO TABS: 15.0000 mg | ORAL_TABLET | Freq: Four times a day (QID) | ORAL | Status: AC | PRN

## 2015-07-01 NOTE — Discharge Instructions (Signed)
SEEK IMMEDIATE MEDICAL ATTENTION IF: °New numbness, tingling, weakness, or problem with the use of your arms or legs.  °Severe back pain not relieved with medications.  °Change in bowel or bladder control.  °Increasing pain in any areas of the body (such as chest or abdominal pain).  °Shortness of breath, dizziness or fainting.  °Nausea (feeling sick to your stomach), vomiting, fever, or sweats.] ° ° °Back Injury Prevention °Back injuries can be very painful. They can also be difficult to heal. After having one back injury, you are more likely to injure your back again. It is important to learn how to avoid injuring or re-injuring your back. The following tips can help you to prevent a back injury. °WHAT SHOULD I KNOW ABOUT PHYSICAL FITNESS? °· Exercise for 30 minutes per day on most days of the week or as directed by your health care provider. Make sure to: °¨ Do aerobic exercises, such as walking, jogging, biking, or swimming. °¨ Do exercises that increase balance and strength, such as tai chi and yoga. These can decrease your risk of falling and injuring your back. °¨ Do stretching exercises to help with flexibility. °¨ Try to develop strong abdominal muscles. Your abdominal muscles provide a lot of the support that is needed by your back. °· Maintain a healthy weight.  This helps to decrease your risk of a back injury. °WHAT SHOULD I KNOW ABOUT MY DIET? °· Talk with your health care provider about your overall diet. Take supplements and vitamins only as directed by your health care provider. °· Talk with your health care provider about how much calcium and vitamin D you need each day. These nutrients help to prevent weakening of the bones (osteoporosis). Osteoporosis can cause broken (fractured) bones, which lead to back pain. °· Include good sources of calcium in your diet, such as dairy products, green leafy vegetables, and products that have had calcium added to them (fortified). °· Include good sources of  vitamin D in your diet, such as milk and foods that are fortified with vitamin D. °WHAT SHOULD I KNOW ABOUT MY POSTURE? °· Sit up straight and stand up straight. Avoid leaning forward when you sit or hunching over when you stand. °· Choose chairs that have good low-back (lumbar) support. °· If you work at a desk, sit close to it so you do not need to lean over. Keep your chin tucked in. Keep your neck drawn back, and keep your elbows bent at a right angle. Your arms should look like the letter "L." °· Sit high and close to the steering wheel when you drive. Add a lumbar support to your car seat, if needed. °· Avoid sitting or standing in one position for very long. Take breaks to get up, stretch, and walk around at least one time every hour. Take breaks every hour if you are driving for long periods of time. °· Sleep on your side with your knees slightly bent, or sleep on your back with a pillow under your knees. Do not lie on the front of your body to sleep. °WHAT SHOULD I KNOW ABOUT LIFTING, TWISTING, AND REACHING? °Lifting and Heavy Lifting °· Avoid heavy lifting, especially repetitive heavy lifting. If you must do heavy lifting: °· Stretch before lifting. °· Work slowly. °· Rest between lifts. °· Use a tool such as a cart or a dolly to move objects if one is available. °· Make several small trips instead of carrying one heavy load. °· Ask for help when you   need it, especially when moving big objects.  Follow these steps when lifting:  Stand with your feet shoulder-width apart.  Get as close to the object as you can. Do not try to pick up a heavy object that is far from your body.  Use handles or lifting straps if they are available.  Bend at your knees. Squat down, but keep your heels off the floor.  Keep your shoulders pulled back, your chin tucked in, and your back straight.  Lift the object slowly while you tighten the muscles in your legs, abdomen, and buttocks. Keep the object as close to the  center of your body as possible.  Follow these steps when putting down a heavy load:  Stand with your feet shoulder-width apart.  Lower the object slowly while you tighten the muscles in your legs, abdomen, and buttocks. Keep the object as close to the center of your body as possible.  Keep your shoulders pulled back, your chin tucked in, and your back straight.  Bend at your knees. Squat down, but keep your heels off the floor.  Use handles or lifting straps if they are available. Twisting and Reaching  Avoid lifting heavy objects above your waist.  Do not twist at your waist while you are lifting or carrying a load. If you need to turn, move your feet.  Do not bend over without bending at your knees.  Avoid reaching over your head, across a table, or for an object on a high surface. WHAT ARE SOME OTHER TIPS?  Avoid wet floors and icy ground. Keep sidewalks clear of ice to prevent falls.  Do not sleep on a mattress that is too soft or too hard.  Keep items that are used frequently within easy reach.  Put heavier objects on shelves at waist level, and put lighter objects on lower or higher shelves.  Find ways to decrease your stress, such as exercise, massage, or relaxation techniques. Stress can build up in your muscles. Tense muscles are more vulnerable to injury.  Talk with your health care provider if you feel anxious or depressed. These conditions can make back pain worse.  Wear flat heel shoes with cushioned soles.  Avoid sudden movements.  Use both shoulder straps when carrying a backpack.  Do not use any tobacco products, including cigarettes, chewing tobacco, or electronic cigarettes. If you need help quitting, ask your health care provider.   This information is not intended to replace advice given to you by your health care provider. Make sure you discuss any questions you have with your health care provider.   Document Released: 02/25/2004 Document Revised:  06/03/2014 Document Reviewed: 01/21/2014 Elsevier Interactive Patient Education [employer].

## 2015-07-01 NOTE — ED Notes (Signed)
Pt taken to xray 

## 2015-07-01 NOTE — ED Notes (Signed)
Patient fell on Sunday while hanging curtain rod and fell striking right flank and hip. Reports bruising over flank and pain with any movement

## 2015-07-01 NOTE — ED Notes (Signed)
Patient getting dressed to be discharged home at this time

## 2015-07-01 NOTE — ED Provider Notes (Signed)
CSN: [id number]     Arrival date & time 07/01/15  4098 History   First MD Initiated Contact with Patient 07/01/15 1003     No chief complaint on file.    (Consider location/radiation/quality/duration/timing/severity/associated sxs/prior Treatment) HPI  Kelsey Marquez Is a 60 year old female who presents emergency Department with chief complaint of back injury. Patient states that 3 days ago she slipped getting out of her tub and fell backward hitting her lumbar spine and sacrum directly against the toilet in her bathroom. She had immediate severe pain but was able to ambulate. Her pain has been significant and she's noticed darkness in her urine since that time, was concerned that she might have hurt her kidney. Patient states that she cannot find a comfortable position. She's been using Goody's powders with minimal relief of her symptoms is unable to bend down or turning over in bed without severe pain. She denies any other urinary symptoms, radiating pain down the legs. She is able to ambulate. She denies saddle anesthesia, bladder or rectal incontinence. He denies any new weakness in the leg.  Past Medical History  Diagnosis Date  . Hiatal hernia    History reviewed. No pertinent past surgical history. No family history on file. Social History  Substance Use Topics  . Smoking status: Never Smoker   . Smokeless tobacco: None  . Alcohol Use: No   OB History    No data available     Review of Systems  Ten systems reviewed and are negative for acute change, except as noted in the HPI.    Allergies  Review of patient's allergies indicates no known allergies.  Home Medications   Prior to Admission medications   Medication Sig Start Date End Date Taking? Authorizing Provider  Aspirin-Acetaminophen-Caffeine (GOODY HEADACHE PO) Take 1 packet by mouth 2 (two) times daily as needed (pain).   Yes Historical Provider, MD  ibuprofen (ADVIL,MOTRIN) 800 MG tablet Take 1 tablet (800 mg total)  by mouth every 8 (eight) hours as needed. 02/25/15   Charmayne Sheer Beers, PA-C   BP 147/82 mmHg  Pulse 80  Temp(Src) 98.5 F (36.9 C) (Oral)  Resp 18  Ht  (1.6 m)  Wt 72.576 kg  BMI 28.35 kg/m2  SpO2 97% Physical Exam  Constitutional: She is oriented to person, place, and time. She appears well-developed and well-nourished. No distress.  HENT:  Head: Normocephalic and atraumatic.  Eyes: Conjunctivae are normal. No scleral icterus.  Neck: Normal range of motion.  Cardiovascular: Normal rate, regular rhythm and normal heart sounds.  Exam reveals no gallop and no friction rub.   No murmur heard. Pulmonary/Chest: Effort normal and breath sounds normal. No respiratory distress.  Abdominal: Soft. Bowel sounds are normal. She exhibits no distension and no mass. There is no tenderness. There is no guarding.  Musculoskeletal:  Bruising over the lower portion of the midline lumbar region. She is exquisitely tender to palpation. There is palpation over the sacrum and right buttock region without bruising or swelling. She has tenderness with range of motion of the right hip. Normal strength bilaterally, DTRs normal, neurovascularly intact  Neurological: She is alert and oriented to person, place, and time.  Skin: Skin is warm and dry. She is not diaphoretic.  Nursing note and vitals reviewed.   ED Course  Procedures (including critical care time) Labs Review Labs Reviewed - No data to display  Imaging Review No results found. I have personally reviewed and evaluated these images and lab results as  part of my medical decision-making.   EKG Interpretation None      MDM   Final diagnoses:  Hematoma  Lower back injury, initial encounter    Patient with back pain.  No neurological deficits and normal neuro exam.  Patient can walk but states is painful.  No loss of bowel or bladder control.  No concern for cauda equina.  No fever, night sweats, weight loss, h/o cancer, IVDU.  RICE  protocol and pain medicine indicated and discussed with patient.      Arthor Captainbigail Atharv Barriere, PA-C 07/01/15 1142  Doug SouSam Jacubowitz, MD 07/01/15 [phone]

## 2015-07-01 NOTE — ED Notes (Signed)
Brought patient back to room; patient getting undressed and into a gown at this time; Tobi BastosAnna, RN present in room

## 2016-06-20 IMAGING — CR DG CHEST 2V
2 series · 2 of 2 positions shown · non-contrast
Comparison: 02/25/2015

CLINICAL DATA: Cough and congestion.  Sore throat for 3 weeks.

EXAM:
CHEST  2 VIEW

[chest pa]
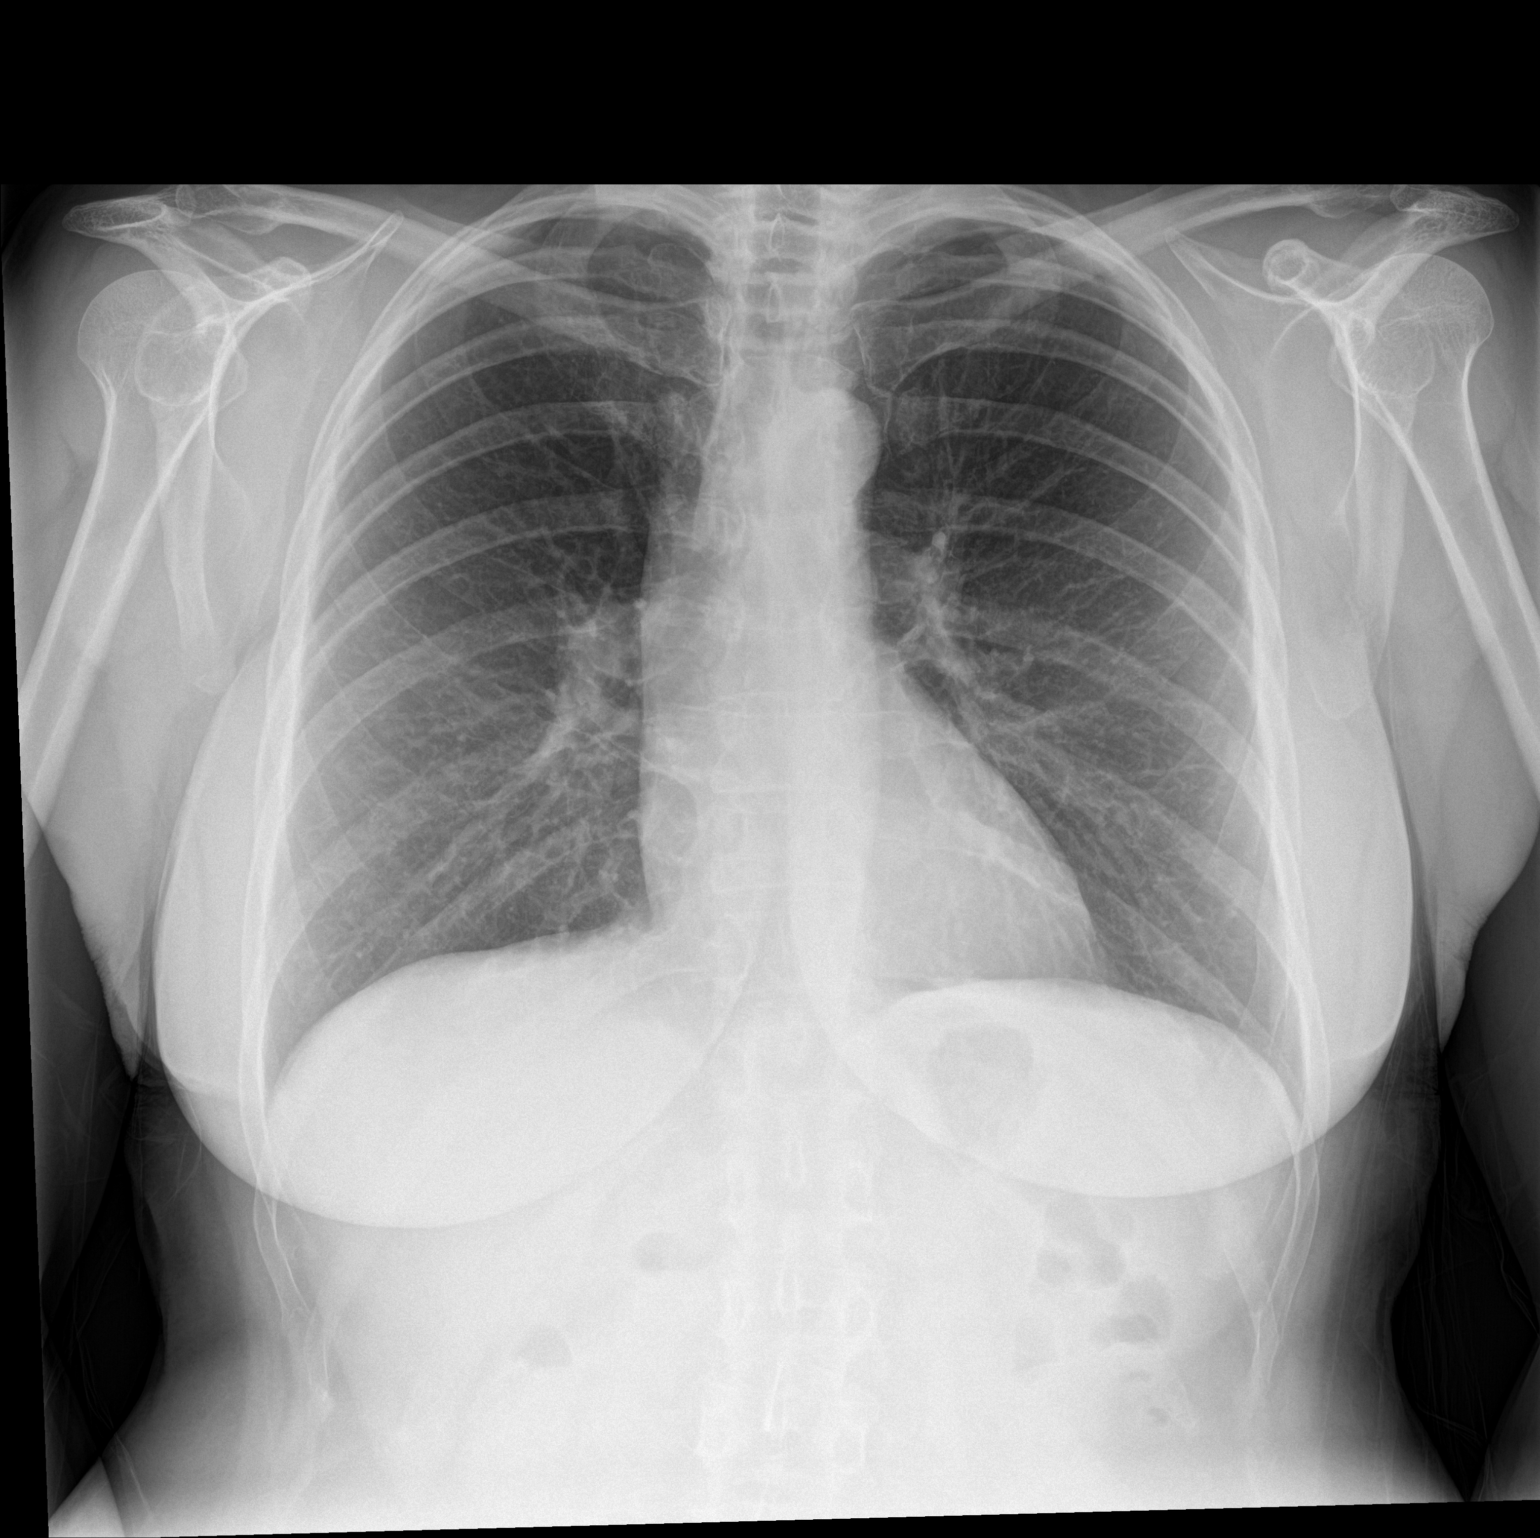

[chest lat]
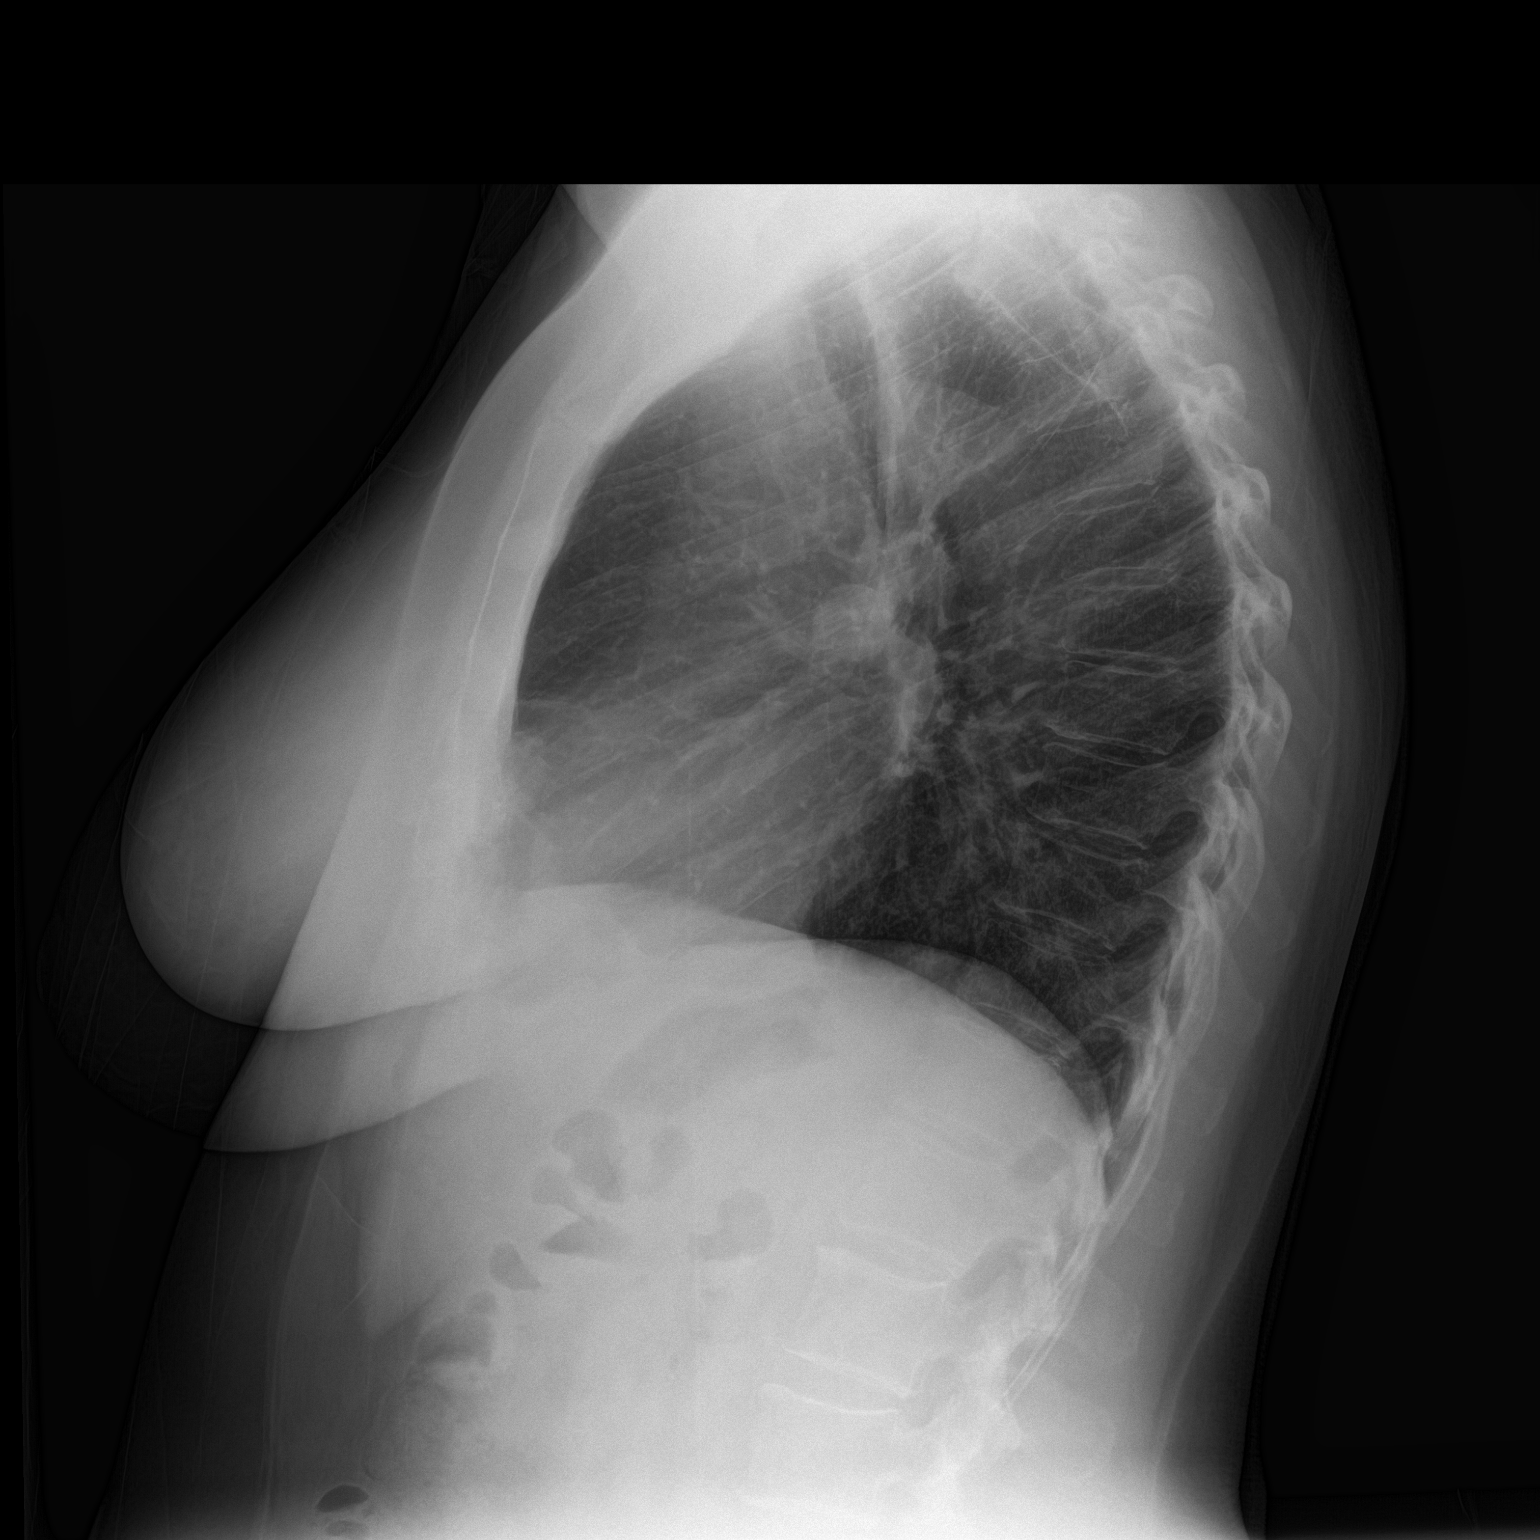

[2 of 2 positions shown; findings below may reference images not displayed]

FINDINGS: The heart size and mediastinal contours are within normal limits.
Both lungs are clear. The visualized skeletal structures are
unremarkable.
IMPRESSION: No active cardiopulmonary disease.

## 2016-09-17 ENCOUNTER — Emergency Department
Admission: EM | Admit: 2016-09-17 | Discharge: 2016-09-17 | Disposition: A | Discharge status: Home / Self Care | Payer: No Typology Code available for payment source | Attending: Emergency Medicine | Admitting: Emergency Medicine

## 2016-09-17 ENCOUNTER — Emergency Department: Payer: No Typology Code available for payment source

## 2016-09-17 ENCOUNTER — Encounter: Payer: Self-pay | Admitting: Emergency Medicine

## 2016-09-17 DIAGNOSIS — Z791 Long term (current) use of non-steroidal anti-inflammatories (NSAID): Secondary | ICD-10-CM | POA: Diagnosis not present

## 2016-09-17 DIAGNOSIS — Y929 Unspecified place or not applicable: Secondary | ICD-10-CM | POA: Diagnosis not present

## 2016-09-17 DIAGNOSIS — Y999 Unspecified external cause status: Secondary | ICD-10-CM | POA: Insufficient documentation

## 2016-09-17 DIAGNOSIS — Y939 Activity, unspecified: Secondary | ICD-10-CM | POA: Insufficient documentation

## 2016-09-17 DIAGNOSIS — S161XXA Strain of muscle, fascia and tendon at neck level, initial encounter: Secondary | ICD-10-CM | POA: Insufficient documentation

## 2016-09-17 DIAGNOSIS — G44309 Post-traumatic headache, unspecified, not intractable: Secondary | ICD-10-CM | POA: Diagnosis not present

## 2016-09-17 DIAGNOSIS — S199XXA Unspecified injury of neck, initial encounter: Secondary | ICD-10-CM | POA: Diagnosis present

## 2016-09-17 DIAGNOSIS — M62838 Other muscle spasm: Secondary | ICD-10-CM | POA: Diagnosis not present

## 2016-09-17 MED ORDER — CYCLOBENZAPRINE HCL 5 MG PO TABS: 5.0000 mg | ORAL_TABLET | Freq: Three times a day (TID) | ORAL | 0 refills | Status: AC | PRN

## 2016-09-17 MED ORDER — CYCLOBENZAPRINE HCL 10 MG PO TABS
5.0000 mg | ORAL_TABLET | Freq: Once | ORAL | Status: AC
  Administered 2016-09-17: 5 mg via ORAL
  Filled 2016-09-17: qty 1

## 2016-09-17 MED ORDER — KETOROLAC TROMETHAMINE 10 MG PO TABS: 10.0000 mg | ORAL_TABLET | Freq: Four times a day (QID) | ORAL | 0 refills | Status: AC | PRN

## 2016-09-17 MED ORDER — ONDANSETRON 4 MG PO TBDP
ORAL_TABLET | ORAL | Status: AC
  Administered 2016-09-17: 4 mg via ORAL
  Filled 2016-09-17: qty 1

## 2016-09-17 MED ORDER — KETOROLAC TROMETHAMINE 30 MG/ML IJ SOLN
30.0000 mg | Freq: Once | INTRAMUSCULAR | Status: AC
  Administered 2016-09-17: 30 mg via INTRAMUSCULAR
  Filled 2016-09-17: qty 1

## 2016-09-17 MED ORDER — ONDANSETRON 4 MG PO TBDP
4.0000 mg | ORAL_TABLET | Freq: Once | ORAL | Status: AC
  Administered 2016-09-17: 4 mg via ORAL
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
## 2016-09-17 NOTE — Discharge Instructions (Signed)
Take medication as prescribed. Return to emergency department if symptoms worsen and follow-up with PCP as needed.    Follow up with Orthopedics if symptoms do not resolve.
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
## 2017-06-22 ENCOUNTER — Emergency Department
Admission: EM | Admit: 2017-06-22 | Discharge: 2017-06-22 | Disposition: A | Discharge status: Home / Self Care | Payer: BLUE CROSS/BLUE SHIELD | Attending: Emergency Medicine | Admitting: Emergency Medicine

## 2017-06-22 ENCOUNTER — Encounter: Payer: Self-pay | Admitting: Emergency Medicine

## 2017-06-22 ENCOUNTER — Other Ambulatory Visit: Payer: Self-pay

## 2017-06-22 DIAGNOSIS — J069 Acute upper respiratory infection, unspecified: Secondary | ICD-10-CM | POA: Insufficient documentation

## 2017-06-22 LAB — GROUP A STREP BY PCR: GROUP A STREP BY PCR: NOT DETECTED

## 2017-06-22 MED ORDER — PSEUDOEPH-BROMPHEN-DM 30-2-10 MG/5ML PO SYRP: 5.0000 mL | ORAL_SOLUTION | Freq: Four times a day (QID) | ORAL | 0 refills | Status: AC | PRN

## 2017-06-22 MED ORDER — AZITHROMYCIN 250 MG PO TABS: ORAL_TABLET | ORAL | 0 refills | Status: AC

## 2017-06-22 NOTE — ED Triage Notes (Signed)
Patient reports sore throat and cough x1 week. Low grade fever reported last night. Reports yellow phlegm production with cough. Patient also reports intermittent dizzy spells with position change "for months". Denies history of vertigo.

## 2017-06-22 NOTE — ED Provider Notes (Signed)
[facility] Emergency Department Provider Note  ____________________________________________  Time seen: Approximately 6:37 PM  I have reviewed the triage vital signs and the nursing notes.   HISTORY  Chief Complaint Sore Throat    HPI Kelsey Marquez is a 62 y.o. female who presents to the emergency department for treatment and evaluation of cough and sore throat that has been present for the past week. No relief with OTC medications.She denies known fever.  Past Medical History:  Diagnosis Date  . Hiatal hernia     There are no active problems to display for this patient.   Past Surgical History:  Procedure Laterality Date  . ABDOMINAL HYSTERECTOMY    . APPENDECTOMY    . TONSILLECTOMY      Prior to Admission medications   Medication Sig Start Date End Date Taking? Authorizing Provider  azithromycin (ZITHROMAX) 250 MG tablet 2 tablets today, then 1 tablet for the next 4 days. 06/22/17   Aibhlinn Kalmar, Rulon Eisenmenger B, FNP  brompheniramine-pseudoephedrine-DM 30-2-10 MG/5ML syrup Take 5 mLs by mouth 4 (four) times daily as needed. 06/22/17   Renelda Kilian, Rulon Eisenmenger B, FNP  cyclobenzaprine (FLEXERIL) 5 MG tablet Take 1 tablet (5 mg total) by mouth 3 (three) times daily as needed. 09/17/16   Little, Traci M, PA-C  morphine (MSIR) 30 MG tablet Take 0.5-1 tablets (15-30 mg total) by mouth every 6 (six) hours as needed for severe pain. 07/01/15   Harris, Cammy Copa, PA-C  naproxen (NAPROSYN) 375 MG tablet Take 1 tablet (375 mg total) by mouth 2 (two) times daily. 07/01/15   Arthor Captain, PA-C    Allergies Patient has no known allergies.  No family history on file.  Social History Social History   Tobacco Use  . Smoking status: Never Smoker  . Smokeless tobacco: Never Used  Substance Use Topics  . Alcohol use: No  . Drug use: Not on file    Review of Systems Constitutional: Negative for fever. Eyes: No visual changes. ENT: Positive for sore throat; negative for difficulty  swallowing. Respiratory: Denies shortness of breath. Gastrointestinal: No abdominal pain.  No nausea, no vomiting.  No diarrhea.  Genitourinary: Negative for dysuria. Musculoskeletal: Negative for generalized body aches. Skin: Negative for rash. Neurological: Negative for headaches, no focal weakness or numbness.  ____________________________________________   PHYSICAL EXAM:  VITAL SIGNS: ED Triage Vitals  Enc Vitals Group     BP 06/22/17 1816 132/77     Pulse Rate 06/22/17 1816 100     Resp 06/22/17 1816 18     Temp 06/22/17 1816 98.4 F (36.9 C)     Temp Source 06/22/17 1816 Oral     SpO2 06/22/17 1816 95 %     Weight 06/22/17 1817 168 lb (76.2 kg)     Height 06/22/17 1817  (1.6 m)     Head Circumference --      Peak Flow --      Pain Score 06/22/17 1817 10     Pain Loc --      Pain Edu? --      Excl. in GC? --    Constitutional: Alert and oriented. Well appearing and in no acute distress. Eyes: Conjunctivae are normal.  Head: Atraumatic. Nose: No congestion/rhinnorhea. Mouth/Throat: Mucous membranes are moist.  Oropharynx with cobblestone clear drainage, tonsils flat without exudate. Uvula is midline. Neck: No stridor.  Lymphatic: Lymphadenopathy: None Cardiovascular: Normal rate, regular rhythm. Good peripheral circulation. Respiratory: Normal respiratory effort. Lungs CTAB. Gastrointestinal: Soft and nontender. Musculoskeletal: No  lower extremity tenderness nor edema.  Neurologic:  Normal speech and language. No gross focal neurologic deficits are appreciated. Speech is normal. No gait instability. Skin:  Skin is warm, dry and intact. No rash noted Psychiatric: Mood and affect are normal. Speech and behavior are normal.  ____________________________________________   LABS (all labs ordered are listed, but only abnormal results are displayed)  Labs Reviewed  GROUP A STREP BY PCR   ____________________________________________  EKG  Not  indicated ____________________________________________  RADIOLOGY  Not indicated. ____________________________________________   PROCEDURES  Procedure(s) performed: None  Critical Care performed: No ____________________________________________   INITIAL IMPRESSION / ASSESSMENT AND PLAN / ED COURSE  62 year old female who presents to the emergency department for treatment and evaluation of symptoms consistent with URI. Symptoms have been present for more than a week without improvement. She will be treated with Bromfed and azithromycin. She was advised to establish a primary care provider or [facility] if not improving over the next few days. She was advised to return to the ER for symptoms that change or worsen if unable to schedule an appointment.  Pertinent labs & imaging results that were available during my care of the patient were reviewed by me and considered in my medical decision making (see chart for details). ____________________________________________  Discharge Medication List as of 06/22/2017  7:04 PM      FINAL CLINICAL IMPRESSION(S) / ED DIAGNOSES  Final diagnoses:  Upper respiratory tract infection, unspecified type    If controlled substance prescribed during this visit, 12 month history viewed on the [facility] prior to issuing an initial prescription for Schedule II or III opiod.   Note:  This document was prepared using Dragon voice recognition software and may include unintentional dictation errors.    Chinita Pester, FNP 06/22/17 2015    Sharman Cheek, MD 06/28/17 [phone]

## 2017-06-22 NOTE — ED Notes (Signed)
Pt ambulatory to POV without difficulty. VSS. NAD. Discharge instructions, RX and follow up reviewed. All questions addressed.  

## 2019-03-02 ENCOUNTER — Other Ambulatory Visit: Payer: Self-pay

## 2019-03-02 ENCOUNTER — Emergency Department: Payer: Self-pay

## 2019-03-02 DIAGNOSIS — R0602 Shortness of breath: Secondary | ICD-10-CM | POA: Insufficient documentation

## 2019-03-02 DIAGNOSIS — U071 COVID-19: Secondary | ICD-10-CM | POA: Insufficient documentation

## 2019-03-02 DIAGNOSIS — Z79899 Other long term (current) drug therapy: Secondary | ICD-10-CM | POA: Insufficient documentation

## 2019-03-02 DIAGNOSIS — R079 Chest pain, unspecified: Secondary | ICD-10-CM | POA: Insufficient documentation

## 2019-03-02 LAB — BASIC METABOLIC PANEL
Anion gap: 10 (ref 5–15)
BUN: 14 mg/dL (ref 8–23)
CO2: 25 mmol/L (ref 22–32)
Calcium: 8.3 mg/dL — ABNORMAL LOW (ref 8.9–10.3)
Chloride: 104 mmol/L (ref 98–111)
Creatinine, Ser: 0.82 mg/dL (ref 0.44–1.00)
GFR calc Af Amer: >60 mL/min (ref >60)
GFR calc non Af Amer: >60 mL/min (ref >60)
Glucose, Bld: 101 mg/dL — ABNORMAL HIGH (ref 70–99)
Potassium: 3.5 mmol/L (ref 3.5–5.1)
Sodium: 139 mmol/L (ref 135–145)

## 2019-03-02 LAB — CBC
HCT: 47.1 % — ABNORMAL HIGH (ref 36.0–46.0)
Hemoglobin: 15.9 g/dL — ABNORMAL HIGH (ref 12.0–15.0)
MCH: 29.6 pg (ref 26.0–34.0)
MCHC: 33.8 g/dL (ref 30.0–36.0)
MCV: 87.7 fL (ref 80.0–100.0)
Platelets: 208 10*3/uL (ref 150–400)
RBC: 5.37 MIL/uL — ABNORMAL HIGH (ref 3.87–5.11)
RDW: 12.5 % (ref 11.5–15.5)
WBC: 4.5 10*3/uL (ref 4.0–10.5)
nRBC: 0 % (ref 0.0–0.2)

## 2019-03-02 LAB — TROPONIN I (HIGH SENSITIVITY): Troponin I (High Sensitivity): 7 ng/L (ref <18)

## 2019-03-02 NOTE — ED Triage Notes (Signed)
Patient reports diagnosed with COVID on Monday, since Tuesday has had sharpe pains into left shoulder through to chest and headache.

## 2019-03-03 ENCOUNTER — Emergency Department
Admission: EM | Admit: 2019-03-03 | Discharge: 2019-03-03 | Disposition: A | Discharge status: Home / Self Care | Payer: Self-pay | Attending: Emergency Medicine | Admitting: Emergency Medicine

## 2019-03-03 DIAGNOSIS — U071 COVID-19: Secondary | ICD-10-CM

## 2019-03-03 DIAGNOSIS — R079 Chest pain, unspecified: Secondary | ICD-10-CM

## 2019-03-03 LAB — TROPONIN I (HIGH SENSITIVITY): Troponin I (High Sensitivity): 7 ng/L (ref <18)

## 2019-03-03 MED ORDER — ONDANSETRON 4 MG PO TBDP
4.0000 mg | ORAL_TABLET | Freq: Once | ORAL | Status: AC
  Administered 2019-03-03: 04:00:00 4 mg via ORAL
  Filled 2019-03-03: qty 1

## 2019-03-03 MED ORDER — ONDANSETRON 4 MG PO TBDP: 4.0000 mg | ORAL_TABLET | Freq: Three times a day (TID) | ORAL | 0 refills | Status: DC | PRN

## 2019-03-03 MED ORDER — KETOROLAC TROMETHAMINE 60 MG/2ML IM SOLN
30.0000 mg | Freq: Once | INTRAMUSCULAR | Status: AC
  Administered 2019-03-03: 30 mg via INTRAMUSCULAR
  Filled 2019-03-03: qty 2

## 2019-03-03 NOTE — ED Provider Notes (Signed)
[facility] Emergency Department Provider Note   ____________________________________________   First MD Initiated Contact with Patient 03/03/19 [phone]     (approximate)  I have reviewed the triage vital signs and the nursing notes.   HISTORY  Chief Complaint Shoulder Pain    HPI Kelsey Marquez is a 64 y.o. female with no significant past medical history who presents to the ED complaining of chest pain.  Patient reports that she tested positive for COVID-19 6 days ago and since then has developed sharp pain moving from her left upper back into her chest.  This is been associated with some cough and shortness of breath, but she has not had any fevers recently.  She also endorses nausea and diarrhea, but has not vomited.  She denies any flank pain, dysuria, or hematuria.  She has not taken anything for pain at home.        Past Medical History:  Diagnosis Date  . Hiatal hernia     There are no problems to display for this patient.   Past Surgical History:  Procedure Laterality Date  . ABDOMINAL HYSTERECTOMY    . APPENDECTOMY    . TONSILLECTOMY      Prior to Admission medications   Medication Sig Start Date End Date Taking? Authorizing Provider  azithromycin (ZITHROMAX) 250 MG tablet 2 tablets today, then 1 tablet for the next 4 days. 06/22/17   Triplett, Rulon Eisenmenger B, FNP  brompheniramine-pseudoephedrine-DM 30-2-10 MG/5ML syrup Take 5 mLs by mouth 4 (four) times daily as needed. 06/22/17   Triplett, Rulon Eisenmenger B, FNP  cyclobenzaprine (FLEXERIL) 5 MG tablet Take 1 tablet (5 mg total) by mouth 3 (three) times daily as needed. 09/17/16   Little, Traci M, PA-C  morphine (MSIR) 30 MG tablet Take 0.5-1 tablets (15-30 mg total) by mouth every 6 (six) hours as needed for severe pain. 07/01/15   Harris, Cammy Copa, PA-C  naproxen (NAPROSYN) 375 MG tablet Take 1 tablet (375 mg total) by mouth 2 (two) times daily. 07/01/15   Harris, Cammy Copa, PA-C  ondansetron (ZOFRAN ODT) 4 MG  disintegrating tablet Take 1 tablet (4 mg total) by mouth every 8 (eight) hours as needed for nausea or vomiting. 03/03/19   Chesley Noon, MD    Allergies Patient has no known allergies.  No family history on file.  Social History Social History   Tobacco Use  . Smoking status: Never Smoker  . Smokeless tobacco: Never Used  Substance Use Topics  . Alcohol use: No  . Drug use: Not on file    Review of Systems  Constitutional: No fever/chills Eyes: No visual changes. ENT: No sore throat. Cardiovascular: Positive for chest pain. Respiratory: Positive for cough and shortness of breath. Gastrointestinal: No abdominal pain.  Positive for nausea, no vomiting.  Positive for diarrhea.  No constipation. Genitourinary: Negative for dysuria. Musculoskeletal: Positive for for back pain. Skin: Negative for rash. Neurological: Negative for headaches, focal weakness or numbness.  ____________________________________________   PHYSICAL EXAM:  VITAL SIGNS: ED Triage Vitals  Enc Vitals Group     BP 03/02/19 1845 (!) 142/91     Pulse Rate 03/02/19 1845 100     Resp 03/02/19 1845 18     Temp 03/02/19 1845 98.6 F (37 C)     Temp Source 03/02/19 1845 Oral     SpO2 03/02/19 1845 95 %     Weight 03/02/19 1907 155 lb (70.3 kg)     Height 03/02/19 1907 5\' 3"  (1.6 m)  Head Circumference --      Peak Flow --      Pain Score 03/02/19 1906 8     Pain Loc --      Pain Edu? --      Excl. in [place]? --     Constitutional: Alert and oriented. Eyes: Conjunctivae are normal. Head: Atraumatic. Nose: No congestion/rhinnorhea. Mouth/Throat: Mucous membranes are moist. Neck: Normal ROM Cardiovascular: Normal rate, regular rhythm. Grossly normal heart sounds. Respiratory: Normal respiratory effort.  No retractions. Lungs CTAB. Gastrointestinal: Soft and nontender. No distention. Genitourinary: deferred Musculoskeletal: No lower extremity tenderness nor edema. Neurologic:  Normal speech  and language. No gross focal neurologic deficits are appreciated. Skin:  Skin is warm, dry and intact. No rash noted. Psychiatric: Mood and affect are normal. Speech and behavior are normal.  ____________________________________________   LABS (all labs ordered are listed, but only abnormal results are displayed)  Labs Reviewed  BASIC METABOLIC PANEL - Abnormal; Notable for the following components:      Result Value   Glucose, Bld 101 (*)    Calcium 8.3 (*)    All other components within normal limits  CBC - Abnormal; Notable for the following components:   RBC 5.37 (*)    Hemoglobin 15.9 (*)    HCT 47.1 (*)    All other components within normal limits  TROPONIN I (HIGH SENSITIVITY)  TROPONIN I (HIGH SENSITIVITY)   ____________________________________________  EKG  ED ECG REPORT I, Blake Divine, the attending physician, personally viewed and interpreted this ECG.   Date: 03/03/2019  EKG Time: 18:51  Rate: 90  Rhythm: normal sinus rhythm  Axis: Normal  Intervals:none  ST&T Change: None   PROCEDURES  Procedure(s) performed (including Critical Care):  Procedures   ____________________________________________   INITIAL IMPRESSION / ASSESSMENT AND PLAN / ED COURSE       64 year old female with recent diagnosis of COVID-19 presents to the ED for increasing pain starting in her back and moving into her chest.  She describes pain as a constant throbbing with similar aches throughout the rest of her body but most severe in her upper back.  EKG with no acute ischemic changes and initial troponin is negative, low suspicion for ACS or myocarditis.  I also do not suspect dissection as she has intact pulses in all extremities, viral pneumonia seems to be the more likely etiology.  Remainder of lab work is reassuring, chest x-ray consistent with COVID-19 and viral pneumonia.  We will check second set troponin, treat symptomatically with Toradol and Zofran.  Repeat troponin  within normal limits, patient reports symptoms improved following symptomatic treatment.  Patient is appropriate for discharge home with PCP follow-up, counseled on strict return precautions.  Patient agrees with plan.      ____________________________________________   FINAL CLINICAL IMPRESSION(S) / ED DIAGNOSES  Final diagnoses:  DVVOH-60 virus infection  Chest pain, unspecified type     ED Discharge Orders         Ordered    ondansetron (ZOFRAN ODT) 4 MG disintegrating tablet  Every 8 hours PRN     03/03/19 0543           Note:  This document was prepared using Dragon voice recognition software and may include unintentional dictation errors.   Blake Divine, MD 03/03/19 [phone]

## 2019-08-06 ENCOUNTER — Telehealth: Payer: Self-pay | Admitting: General Practice

## 2019-08-06 NOTE — Telephone Encounter (Signed)
Individual has been contacted 3+ times regarding ED referral and has been given information regarding the clinic. No further attempts to contact individual will be made. 

## 2019-11-30 ENCOUNTER — Emergency Department (HOSPITAL_BASED_OUTPATIENT_CLINIC_OR_DEPARTMENT_OTHER): Payer: Self-pay

## 2019-11-30 ENCOUNTER — Other Ambulatory Visit: Payer: Self-pay

## 2019-11-30 ENCOUNTER — Emergency Department (HOSPITAL_BASED_OUTPATIENT_CLINIC_OR_DEPARTMENT_OTHER)
Admission: EM | Admit: 2019-11-30 | Discharge: 2019-11-30 | Disposition: A | Discharge status: Home / Self Care | Payer: Self-pay | Attending: Emergency Medicine | Admitting: Emergency Medicine

## 2019-11-30 ENCOUNTER — Encounter (HOSPITAL_BASED_OUTPATIENT_CLINIC_OR_DEPARTMENT_OTHER): Payer: Self-pay | Admitting: *Deleted

## 2019-11-30 DIAGNOSIS — K5792 Diverticulitis of intestine, part unspecified, without perforation or abscess without bleeding: Secondary | ICD-10-CM

## 2019-11-30 DIAGNOSIS — R0789 Other chest pain: Secondary | ICD-10-CM | POA: Insufficient documentation

## 2019-11-30 DIAGNOSIS — K5732 Diverticulitis of large intestine without perforation or abscess without bleeding: Secondary | ICD-10-CM | POA: Insufficient documentation

## 2019-11-30 LAB — COMPREHENSIVE METABOLIC PANEL
ALT: 30 U/L (ref 0–44)
AST: 32 U/L (ref 15–41)
Albumin: 3.9 g/dL (ref 3.5–5.0)
Alkaline Phosphatase: 93 U/L (ref 38–126)
Anion gap: 9 (ref 5–15)
BUN: 13 mg/dL (ref 8–23)
CO2: 26 mmol/L (ref 22–32)
Calcium: 8.8 mg/dL — ABNORMAL LOW (ref 8.9–10.3)
Chloride: 103 mmol/L (ref 98–111)
Creatinine, Ser: 0.98 mg/dL (ref 0.44–1.00)
GFR, Estimated: >60 mL/min (ref >60)
Glucose, Bld: 94 mg/dL (ref 70–99)
Potassium: 4.2 mmol/L (ref 3.5–5.1)
Sodium: 138 mmol/L (ref 135–145)
Total Bilirubin: 0.4 mg/dL (ref 0.3–1.2)
Total Protein: 7.6 g/dL (ref 6.5–8.1)

## 2019-11-30 LAB — CBC WITH DIFFERENTIAL/PLATELET
Abs Immature Granulocytes: 0.04 10*3/uL (ref 0.00–0.07)
Basophils Absolute: 0 10*3/uL (ref 0.0–0.1)
Basophils Relative: 0 %
Eosinophils Absolute: 0.4 10*3/uL (ref 0.0–0.5)
Eosinophils Relative: 4 %
HCT: 45.5 % (ref 36.0–46.0)
Hemoglobin: 14.9 g/dL (ref 12.0–15.0)
Immature Granulocytes: 0 %
Lymphocytes Relative: 24 %
Lymphs Abs: 2.3 10*3/uL (ref 0.7–4.0)
MCH: 29.6 pg (ref 26.0–34.0)
MCHC: 32.7 g/dL (ref 30.0–36.0)
MCV: 90.5 fL (ref 80.0–100.0)
Monocytes Absolute: 0.8 10*3/uL (ref 0.1–1.0)
Monocytes Relative: 8 %
Neutro Abs: 6.2 10*3/uL (ref 1.7–7.7)
Neutrophils Relative %: 64 %
Platelets: 270 10*3/uL (ref 150–400)
RBC: 5.03 MIL/uL (ref 3.87–5.11)
RDW: 12.5 % (ref 11.5–15.5)
WBC: 9.8 10*3/uL (ref 4.0–10.5)
nRBC: 0 % (ref 0.0–0.2)

## 2019-11-30 MED ORDER — FENTANYL CITRATE (PF) 100 MCG/2ML IJ SOLN
50.0000 ug | Freq: Once | INTRAMUSCULAR | Status: DC
  Filled 2019-11-30: qty 2

## 2019-11-30 MED ORDER — ONDANSETRON HCL 4 MG/2ML IJ SOLN
4.0000 mg | Freq: Once | INTRAMUSCULAR | Status: AC
  Administered 2019-11-30: 4 mg via INTRAVENOUS
  Filled 2019-11-30: qty 2

## 2019-11-30 MED ORDER — SODIUM CHLORIDE 0.9 % IV BOLUS
1000.0000 mL | Freq: Once | INTRAVENOUS | Status: AC
  Administered 2019-11-30: 1000 mL via INTRAVENOUS

## 2019-11-30 MED ORDER — AMOXICILLIN-POT CLAVULANATE 875-125 MG PO TABS
1.0000 | ORAL_TABLET | Freq: Once | ORAL | Status: AC
  Administered 2019-11-30: 1 via ORAL
  Filled 2019-11-30: qty 1

## 2019-11-30 MED ORDER — AMOXICILLIN-POT CLAVULANATE 875-125 MG PO TABS: 1.0000 | ORAL_TABLET | Freq: Two times a day (BID) | ORAL | 0 refills | Status: AC

## 2019-11-30 MED ORDER — KETOROLAC TROMETHAMINE 15 MG/ML IJ SOLN
15.0000 mg | Freq: Once | INTRAMUSCULAR | Status: AC
  Administered 2019-11-30: 15 mg via INTRAVENOUS
  Filled 2019-11-30: qty 1

## 2019-11-30 MED ORDER — ONDANSETRON HCL 4 MG PO TABS: 4.0000 mg | ORAL_TABLET | Freq: Three times a day (TID) | ORAL | 0 refills | Status: AC | PRN

## 2019-11-30 MED ORDER — ACETAMINOPHEN 325 MG PO TABS
650.0000 mg | ORAL_TABLET | Freq: Once | ORAL | Status: AC
  Administered 2019-11-30: 650 mg via ORAL
  Filled 2019-11-30: qty 2

## 2019-11-30 NOTE — Discharge Instructions (Signed)
Take antibiotic as prescribed.  Take Zofran for nausea as needed.  Recommend Tylenol and Motrin for pain as needed.  Please return to the ED if symptoms worsen.  Please follow-up with your primary care doctor.

## 2019-11-30 NOTE — ED Notes (Signed)
Patient transported to CT 

## 2019-11-30 NOTE — ED Notes (Signed)
EDP at bedside to assess infiltration site.

## 2019-11-30 NOTE — ED Notes (Signed)
Attempted x 2 to obtain IV/blood.

## 2019-11-30 NOTE — ED Triage Notes (Signed)
Pt reports left side abdominal pain since Tuesday. Denies n/v/d. LBM was yesterday. Reports abdomen is sore to touch

## 2019-11-30 NOTE — ED Provider Notes (Signed)
[facility] EMERGENCY DEPARTMENT Provider Note   CSN: [id number] Arrival date & time: 11/30/19  1955     History Chief Complaint  Patient presents with  . Abdominal Pain    Kelsey Marquez is a 64 y.o. female.  The history is provided by the patient.  Abdominal Pain Pain location:  L flank and LUQ Pain quality: aching   Pain radiates to:  Chest Pain severity:  Mild Onset quality:  Gradual Timing:  Constant Progression:  Unchanged Chronicity:  New Context: not eating and not trauma   Context comment:  Possible from lifting grandchild up. No urinary symtpoms Relieved by:  Nothing Worsened by:  Nothing Associated symptoms: chest pain (left lower chest wall pain)   Associated symptoms: no anorexia, no chills, no constipation, no cough, no dysuria, no fever, no hematuria, no nausea, no shortness of breath, no sore throat and no vomiting   Risk factors: multiple surgeries        Past Medical History:  Diagnosis Date  . Hiatal hernia     There are no problems to display for this patient.   Past Surgical History:  Procedure Laterality Date  . ABDOMINAL HYSTERECTOMY    . APPENDECTOMY    . TONSILLECTOMY       OB History   No obstetric history on file.     No family history on file.  Social History   Tobacco Use  . Smoking status: Never Smoker  . Smokeless tobacco: Never Used  Vaping Use  . Vaping Use: Never used  Substance Use Topics  . Alcohol use: No  . Drug use: Never    Home Medications Prior to Admission medications   Medication Sig Start Date End Date Taking? Authorizing Provider  amoxicillin-clavulanate (AUGMENTIN) 875-125 MG tablet Take 1 tablet by mouth 2 (two) times daily for 10 days. 11/30/19 12/10/19  Zuriel Roskos, DO  azithromycin (ZITHROMAX) 250 MG tablet 2 tablets today, then 1 tablet for the next 4 days. 06/22/17   Triplett, Rulon Eisenmenger B, FNP  brompheniramine-pseudoephedrine-DM 30-2-10 MG/5ML syrup Take 5 mLs by mouth 4 (four) times  daily as needed. 06/22/17   Triplett, Rulon Eisenmenger B, FNP  cyclobenzaprine (FLEXERIL) 5 MG tablet Take 1 tablet (5 mg total) by mouth 3 (three) times daily as needed. 09/17/16   Little, Traci M, PA-C  morphine (MSIR) 30 MG tablet Take 0.5-1 tablets (15-30 mg total) by mouth every 6 (six) hours as needed for severe pain. 07/01/15   Harris, Cammy Copa, PA-C  naproxen (NAPROSYN) 375 MG tablet Take 1 tablet (375 mg total) by mouth 2 (two) times daily. 07/01/15   Harris, Abigail, PA-C  ondansetron (ZOFRAN ODT) 4 MG disintegrating tablet Take 1 tablet (4 mg total) by mouth every 8 (eight) hours as needed for nausea or vomiting. 03/03/19   Chesley Noon, MD  ondansetron (ZOFRAN) 4 MG tablet Take 1 tablet (4 mg total) by mouth every 8 (eight) hours as needed for up to 20 doses for nausea or vomiting. 11/30/19   Virgina Norfolk, DO    Allergies    Patient has no known allergies.  Review of Systems   Review of Systems  Constitutional: Negative for chills and fever.  HENT: Negative for ear pain and sore throat.   Eyes: Negative for pain and visual disturbance.  Respiratory: Negative for cough and shortness of breath.   Cardiovascular: Positive for chest pain (left lower chest wall pain). Negative for palpitations.  Gastrointestinal: Positive for abdominal pain. Negative for anorexia, constipation, nausea and  vomiting.  Genitourinary: Negative for dysuria and hematuria.  Musculoskeletal: Negative for arthralgias and back pain.  Skin: Negative for color change and rash.  Neurological: Negative for seizures and syncope.  All other systems reviewed and are negative.   Physical Exam Updated Vital Signs  ED Triage Vitals  Enc Vitals Group     BP 11/30/19 2004 138/69     Pulse Rate 11/30/19 2004 84     Resp 11/30/19 2004 20     Temp 11/30/19 2004 98.4 F (36.9 C)     Temp Source 11/30/19 2004 Oral     SpO2 11/30/19 2004 99 %     Weight 11/30/19 2003 165 lb (74.8 kg)     Height 11/30/19 2003 5\' 3"  (1.6 m)      Head Circumference --      Peak Flow --      Pain Score 11/30/19 2001 10     Pain Loc --      Pain Edu? --      Excl. in GC? --     Physical Exam Vitals and nursing note reviewed.  Constitutional:      General: She is not in acute distress.    Appearance: She is well-developed. She is not ill-appearing.  HENT:     Head: Normocephalic and atraumatic.  Eyes:     Extraocular Movements: Extraocular movements intact.     Conjunctiva/sclera: Conjunctivae normal.  Cardiovascular:     Rate and Rhythm: Normal rate and regular rhythm.     Heart sounds: Normal heart sounds. No murmur heard.   Pulmonary:     Effort: Pulmonary effort is normal. No respiratory distress.     Breath sounds: Normal breath sounds.  Abdominal:     General: Abdomen is flat. There is no distension.     Palpations: Abdomen is soft.     Tenderness: There is no abdominal tenderness. There is left CVA tenderness (left anterior ribs). There is no guarding.  Musculoskeletal:     Cervical back: Neck supple.  Skin:    General: Skin is warm and dry.     Capillary Refill: Capillary refill takes less than 2 seconds.  Neurological:     General: No focal deficit present.     Mental Status: She is alert.     ED Results / Procedures / Treatments   Labs (all labs ordered are listed, but only abnormal results are displayed) Labs Reviewed  CBC WITH DIFFERENTIAL/PLATELET  COMPREHENSIVE METABOLIC PANEL    EKG None  Radiology DG Chest 2 View  Result Date: 11/30/2019 CLINICAL DATA:  Left flank and rib pain. EXAM: CHEST - 2 VIEW COMPARISON:  03/02/2019 FINDINGS: The heart size and mediastinal contours are within normal limits. Both lungs are clear. The visualized skeletal structures are unremarkable. IMPRESSION: Normal study. Electronically Signed   By: 03/04/2019 M.D.   On: 11/30/2019 20:40   CT Renal Stone Study  Result Date: 11/30/2019 CLINICAL DATA:  Left-sided abdominal pain. EXAM: CT ABDOMEN AND PELVIS  WITHOUT CONTRAST TECHNIQUE: Multidetector CT imaging of the abdomen and pelvis was performed following the standard protocol without IV contrast. COMPARISON:  None. FINDINGS: Lower chest: No acute abnormality. Hepatobiliary: No focal liver abnormality is seen. No gallstones, gallbladder wall thickening, or biliary dilatation. Pancreas: Unremarkable. No pancreatic ductal dilatation or surrounding inflammatory changes. Spleen: Normal in size without focal abnormality. Adrenals/Urinary Tract: Adrenal glands are unremarkable. Kidneys are normal, without renal calculi, focal lesion, or hydronephrosis. Bladder is unremarkable. Stomach/Bowel: Stomach is  within normal limits. The appendix is surgically absent. There is no evidence of bowel dilatation. A markedly inflamed diverticulum is seen within the posterior aspect of the mid sigmoid colon. There is no evidence of associated perforation or abscess. Vascular/Lymphatic: There is mild calcification of the abdominal aorta and bilateral common iliac arteries, without evidence of aneurysmal dilatation. Very mild, hazy, nonspecific mesenteric inflammatory fat stranding is seen along the medial aspect of the mid left abdomen. Numerous subcentimeter mesenteric lymph nodes are also seen within this region Reproductive: Status post hysterectomy. No adnexal masses. Other: No abdominal wall hernia or abnormality. No abdominopelvic ascites. Musculoskeletal: No acute or significant osseous findings. IMPRESSION: 1. Marked severity sigmoid diverticulitis without evidence of associated perforation or abscess. 2. Mild nonspecific mesenteric inflammatory fat stranding within the mid left abdomen which may be chronic in nature. Sequelae associated with mild mesenteric panniculitis is less likely, but cannot be excluded. Aortic Atherosclerosis (ICD10-I70.0). Electronically Signed   By: Aram Candela M.D.   On: 11/30/2019 20:38    Procedures Procedures (including critical care  time)  Medications Ordered in ED Medications  amoxicillin-clavulanate (AUGMENTIN) 875-125 MG per tablet 1 tablet (1 tablet Oral Given 11/30/19 2111)  sodium chloride 0.9 % bolus 1,000 mL ( Intravenous Stopped 11/30/19 2120)  ketorolac (TORADOL) 15 MG/ML injection 15 mg (15 mg Intravenous Given 11/30/19 2113)  acetaminophen (TYLENOL) tablet 650 mg (650 mg Oral Given 11/30/19 2111)  ondansetron (ZOFRAN) injection 4 mg (4 mg Intravenous Given 11/30/19 2113)    ED Course  I have reviewed the triage vital signs and the nursing notes.  Pertinent labs & imaging results that were available during my care of the patient were reviewed by me and considered in my medical decision making (see chart for details).    MDM Rules/Calculators/A&P                          Derrian Rodak is a 64 year old female with no significant medical history presents to the ED with left-sided chest wall left CVA pain.  Normal vitals.  No fever.  No direct trauma.  No history of kidney stones.  No urinary symptoms.  Pain with movement and deep breathing.  However pain all throughout the abdomen as well.  Does appear to have some tenderness in the left upper quadrant left CVA, left ribs anteriorly.  Suspect possibly musculoskeletal process, urinary infection, bowel obstruction, diverticulitis.  CT scan consistent with diverticulitis.  No abscess, no perforation.  There is also some nonspecific mesenteric inflammation as well that is likely causing her pain as well.  Patient given Augmentin, Toradol, Tylenol, Zofran, fluid bolus with improvement.  Lab work was overall unremarkable.  Recommend follow-up with primary care doctor.  Prescribe Zofran and Augmentin.  Discharged in good condition.  This chart was dictated using voice recognition software.  Despite best efforts to proofread,  errors can occur which can change the documentation meaning.    Final Clinical Impression(s) / ED Diagnoses Final diagnoses:  Diverticulitis     Rx / DC Orders ED Discharge Orders         Ordered    amoxicillin-clavulanate (AUGMENTIN) 875-125 MG tablet  2 times daily        11/30/19 2056    ondansetron (ZOFRAN) 4 MG tablet  Every 8 hours PRN        11/30/19 2056           Virgina Norfolk, DO 11/30/19 2137

## 2021-02-01 ENCOUNTER — Other Ambulatory Visit: Payer: Self-pay

## 2021-02-01 ENCOUNTER — Emergency Department (HOSPITAL_BASED_OUTPATIENT_CLINIC_OR_DEPARTMENT_OTHER)
Admission: EM | Admit: 2021-02-01 | Discharge: 2021-02-01 | Disposition: A | Discharge status: Home / Self Care | Payer: Medicare HMO | Attending: Emergency Medicine | Admitting: Emergency Medicine

## 2021-02-01 ENCOUNTER — Encounter (HOSPITAL_BASED_OUTPATIENT_CLINIC_OR_DEPARTMENT_OTHER): Payer: Self-pay | Admitting: Emergency Medicine

## 2021-02-01 ENCOUNTER — Emergency Department (HOSPITAL_BASED_OUTPATIENT_CLINIC_OR_DEPARTMENT_OTHER): Payer: Medicare HMO

## 2021-02-01 DIAGNOSIS — Z20822 Contact with and (suspected) exposure to covid-19: Secondary | ICD-10-CM | POA: Diagnosis not present

## 2021-02-01 DIAGNOSIS — R1013 Epigastric pain: Secondary | ICD-10-CM | POA: Diagnosis present

## 2021-02-01 DIAGNOSIS — R112 Nausea with vomiting, unspecified: Secondary | ICD-10-CM | POA: Diagnosis not present

## 2021-02-01 LAB — CBC
HCT: 49.5 % — ABNORMAL HIGH (ref 36.0–46.0)
Hemoglobin: 16.7 g/dL — ABNORMAL HIGH (ref 12.0–15.0)
MCH: 29.1 pg (ref 26.0–34.0)
MCHC: 33.7 g/dL (ref 30.0–36.0)
MCV: 86.2 fL (ref 80.0–100.0)
Platelets: 256 10*3/uL (ref 150–400)
RBC: 5.74 MIL/uL — ABNORMAL HIGH (ref 3.87–5.11)
RDW: 13.1 % (ref 11.5–15.5)
WBC: 10.1 10*3/uL (ref 4.0–10.5)
nRBC: 0 % (ref 0.0–0.2)

## 2021-02-01 LAB — URINALYSIS, MICROSCOPIC (REFLEX): RBC / HPF: NONE SEEN RBC/hpf (ref 0–5)

## 2021-02-01 LAB — URINALYSIS, ROUTINE W REFLEX MICROSCOPIC
Glucose, UA: NEGATIVE mg/dL
Hgb urine dipstick: NEGATIVE
Ketones, ur: 80 mg/dL — AB
Nitrite: NEGATIVE
Protein, ur: NEGATIVE mg/dL
Specific Gravity, Urine: >1.03 — ABNORMAL HIGH (ref 1.005–1.030)
pH: 5.5 (ref 5.0–8.0)

## 2021-02-01 LAB — COMPREHENSIVE METABOLIC PANEL
ALT: 33 U/L (ref 0–44)
AST: 29 U/L (ref 15–41)
Albumin: 4.3 g/dL (ref 3.5–5.0)
Alkaline Phosphatase: 94 U/L (ref 38–126)
Anion gap: 11 (ref 5–15)
BUN: 15 mg/dL (ref 8–23)
CO2: 20 mmol/L — ABNORMAL LOW (ref 22–32)
Calcium: 9 mg/dL (ref 8.9–10.3)
Chloride: 106 mmol/L (ref 98–111)
Creatinine, Ser: 0.81 mg/dL (ref 0.44–1.00)
GFR, Estimated: >60 mL/min (ref >60)
Glucose, Bld: 121 mg/dL — ABNORMAL HIGH (ref 70–99)
Potassium: 4.1 mmol/L (ref 3.5–5.1)
Sodium: 137 mmol/L (ref 135–145)
Total Bilirubin: 0.8 mg/dL (ref 0.3–1.2)
Total Protein: 8.1 g/dL (ref 6.5–8.1)

## 2021-02-01 LAB — RESP PANEL BY RT-PCR (FLU A&B, COVID) ARPGX2
Influenza A by PCR: NEGATIVE
Influenza B by PCR: NEGATIVE
SARS Coronavirus 2 by RT PCR: NEGATIVE

## 2021-02-01 LAB — LIPASE, BLOOD: Lipase: 28 U/L (ref 11–51)

## 2021-02-01 LAB — TROPONIN I (HIGH SENSITIVITY): Troponin I (High Sensitivity): 4 ng/L (ref <18)

## 2021-02-01 MED ORDER — ALUM & MAG HYDROXIDE-SIMETH 200-200-20 MG/5ML PO SUSP
15.0000 mL | Freq: Once | ORAL | Status: AC
  Administered 2021-02-01: 15 mL via ORAL
  Filled 2021-02-01: qty 30

## 2021-02-01 MED ORDER — PANTOPRAZOLE SODIUM 40 MG IV SOLR
40.0000 mg | Freq: Once | INTRAVENOUS | Status: AC
Start: 2021-02-01 — End: 2021-02-01
  Administered 2021-02-01: 40 mg via INTRAVENOUS
  Filled 2021-02-01: qty 40

## 2021-02-01 MED ORDER — ONDANSETRON 4 MG PO TBDP: 4.0000 mg | ORAL_TABLET | Freq: Three times a day (TID) | ORAL | 0 refills | Status: AC | PRN

## 2021-02-01 MED ORDER — LOPERAMIDE HCL 2 MG PO CAPS
2.0000 mg | ORAL_CAPSULE | Freq: Four times a day (QID) | ORAL | 0 refills | Status: AC | PRN
Start: 2021-02-01 — End: ?

## 2021-02-01 MED ORDER — IOHEXOL 300 MG/ML  SOLN
100.0000 mL | Freq: Once | INTRAMUSCULAR | Status: AC | PRN
  Administered 2021-02-01: 100 mL via INTRAVENOUS

## 2021-02-01 MED ORDER — SODIUM CHLORIDE 0.9 % IV BOLUS
500.0000 mL | Freq: Once | INTRAVENOUS | Status: AC
  Administered 2021-02-01: 500 mL via INTRAVENOUS

## 2021-02-01 MED ORDER — ONDANSETRON HCL 4 MG/2ML IJ SOLN
4.0000 mg | Freq: Once | INTRAMUSCULAR | Status: AC
  Administered 2021-02-01: 4 mg via INTRAVENOUS
  Filled 2021-02-01: qty 2

## 2021-02-01 MED ORDER — FENTANYL CITRATE PF 50 MCG/ML IJ SOSY
50.0000 ug | PREFILLED_SYRINGE | Freq: Once | INTRAMUSCULAR | Status: AC
  Administered 2021-02-01: 50 ug via INTRAVENOUS
  Filled 2021-02-01: qty 1

## 2021-02-01 MED ORDER — PANTOPRAZOLE SODIUM 40 MG PO TBEC: 40.0000 mg | DELAYED_RELEASE_TABLET | Freq: Every day | ORAL | 0 refills | Status: AC

## 2021-02-01 NOTE — Discharge Instructions (Addendum)

## 2021-02-01 NOTE — ED Triage Notes (Signed)
Pt having N/V/D all night, this am she started to have burning sensation to abdomen and chest.  Some fever at home.  Ibuprofen at 1600.  Not keeping po fluids down.

## 2021-02-01 NOTE — ED Notes (Signed)
2 gold tube drawn per lab request

## 2021-02-01 NOTE — ED Provider Notes (Signed)
Emergency Department Provider Note   I have reviewed the triage vital signs and the nursing notes.   HISTORY  Chief Complaint Abdominal Pain and Chest Pain   HPI Kelsey Marquez is a 66 y.o. female with PMH of hiatal hernia presents to the ED with nausea, vomiting, and burning epigastric abdominal pain. Pain is mainly mid-epigastric and travels to the chest. No SOB or palpitations. No exertional or pleuritic symptoms. No radiation of symptoms or modifying factors. Patient has had vomiting and diarrhea which is is non-bloody.    Review of Systems  Constitutional: No fever/chills Eyes: No visual changes. ENT: No sore throat. Cardiovascular: Positive chest pain. Respiratory: Denies shortness of breath. Gastrointestinal: Epigastric abdominal pain. Positive nausea, vomiting, and diarrhea.  No constipation. Genitourinary: Negative for dysuria. Musculoskeletal: Negative for back pain. Skin: Negative for rash. Neurological: Negative for headaches, focal weakness or numbness.  10-point ROS otherwise negative.  ____________________________________________   PHYSICAL EXAM:  VITAL SIGNS: ED Triage Vitals  Enc Vitals Group     BP 02/01/21 1814 (!) 152/83     Pulse Rate 02/01/21 1814 (!) 104     Resp 02/01/21 1814 20     Temp 02/01/21 1814 98.5 F (36.9 C)     Temp Source 02/01/21 1814 Oral     SpO2 02/01/21 1814 94 %     Weight 02/01/21 1814 165 lb (74.8 kg)     Height 02/01/21 1814 5\' 3"  (1.6 m)   Constitutional: Alert and oriented. Well appearing and in no acute distress. Eyes: Conjunctivae are normal.  Head: Atraumatic. Nose: No congestion/rhinnorhea. Mouth/Throat: Mucous membranes are moist.   Neck: No stridor.  Cardiovascular: Normal rate, regular rhythm. Good peripheral circulation. Grossly normal heart sounds.   Respiratory: Normal respiratory effort.  No retractions. Lungs CTAB. Gastrointestinal: Soft with mild mid-epigastric tenderness. Negative Murphy's sign. No  distention.  Musculoskeletal: No lower extremity tenderness nor edema. No gross deformities of extremities. Neurologic:  Normal speech and language. No gross focal neurologic deficits are appreciated.  Skin:  Skin is warm, dry and intact. No rash noted.   ____________________________________________   LABS (all labs ordered are listed, but only abnormal results are displayed)  Labs Reviewed  COMPREHENSIVE METABOLIC PANEL - Abnormal; Notable for the following components:      Result Value   CO2 20 (*)    Glucose, Bld 121 (*)    All other components within normal limits  CBC - Abnormal; Notable for the following components:   RBC 5.74 (*)    Hemoglobin 16.7 (*)    HCT 49.5 (*)    All other components within normal limits  URINALYSIS, ROUTINE W REFLEX MICROSCOPIC - Abnormal; Notable for the following components:   Specific Gravity, Urine >1.030 (*)    Bilirubin Urine SMALL (*)    Ketones, ur 80 (*)    Leukocytes,Ua TRACE (*)    All other components within normal limits  URINALYSIS, MICROSCOPIC (REFLEX) - Abnormal; Notable for the following components:   Bacteria, UA RARE (*)    All other components within normal limits  RESP PANEL BY RT-PCR (FLU A&B, COVID) ARPGX2  LIPASE, BLOOD  TROPONIN I (HIGH SENSITIVITY)  TROPONIN I (HIGH SENSITIVITY)   ____________________________________________  EKG   EKG Interpretation  Date/Time:  Monday February 01 2021 18:12:56 EST Ventricular Rate:  108 PR Interval:  160 QRS Duration: 70 QT Interval:  340 QTC Calculation: 455 R Axis:   54 Text Interpretation: Sinus tachycardia Right atrial enlargement Nonspecific ST and T  wave abnormality Abnormal ECG When compared with ECG of 02-Mar-2019 18:51, PREVIOUS ECG IS PRESENT Similar to Jan 30th tracing Confirmed by Nanda Quinton [phone]) on 02/01/2021 6:28:22 PM        ____________________________________________  RADIOLOGY  DG Chest 2 View  Result Date: 02/01/2021 CLINICAL DATA:  Nausea,  vomiting and diarrhea with a burning sensation to the abdomen and chest. EXAM: CHEST - 2 VIEW COMPARISON:  November 30, 2019 FINDINGS: The heart size and mediastinal contours are within normal limits. Both lungs are clear. The visualized skeletal structures are unremarkable. IMPRESSION: No active cardiopulmonary disease. Electronically Signed   By: Virgina Norfolk M.D.   On: 02/01/2021 19:58   CT ABDOMEN PELVIS W CONTRAST  Result Date: 02/01/2021 CLINICAL DATA:  Abdominal pain, acute, nonlocalized EXAM: CT ABDOMEN AND PELVIS WITH CONTRAST TECHNIQUE: Multidetector CT imaging of the abdomen and pelvis was performed using the standard protocol following bolus administration of intravenous contrast. CONTRAST:  162mL OMNIPAQUE IOHEXOL 300 MG/ML  SOLN COMPARISON:  CT abdomen pelvis 11/30/2019 FINDINGS: Lower chest: No acute abnormality.  Tiny hiatal hernia. Hepatobiliary: No focal liver abnormality. No gallstones, gallbladder wall thickening, or pericholecystic fluid. No biliary dilatation. Pancreas: No focal lesion. Normal pancreatic contour. No surrounding inflammatory changes. No main pancreatic ductal dilatation. Spleen: Normal in size without focal abnormality. Adrenals/Urinary Tract: No adrenal nodule bilaterally. Bilateral kidneys enhance symmetrically. No hydronephrosis. No hydroureter. The urinary bladder is unremarkable. On delayed imaging, there is no urothelial wall thickening and there are no filling defects in the opacified portions of the bilateral collecting systems or ureters. Stomach/Bowel: Stomach is within normal limits. No evidence of bowel wall thickening or dilatation. Normal enhancement of the bowel wall. No pneumatosis. Colonic diverticulosis. Status post appendectomy. Vascular/Lymphatic: The main portal, splenic, superior mesenteric veins are patent. No abdominal aorta or iliac aneurysm. Mild to moderate atherosclerotic plaque of the aorta and its branches. No abdominal, pelvic, or inguinal  lymphadenopathy. Reproductive: Status post hysterectomy. No adnexal masses. Other: Persist haziness of the small bowel mesentery with associated prominent lymph nodes. Musculoskeletal: No abdominal wall hernia or abnormality. No suspicious lytic or blastic osseous lesions. No acute displaced fracture. Stable chronic L1 vertebral body inferior endplate compression fracture. IMPRESSION: 1. Tiny hiatal hernia. 2. Persistent nonspecific misty mesentery of the small bowel. Superior mesenteric, main portal, splenic veins are patent. 3. Colonic diverticulosis with no acute diverticulitis. 4.  Aortic Atherosclerosis (ICD10-I70.0). Electronically Signed   By: Iven Finn M.D.   On: 02/01/2021 20:02    ____________________________________________   PROCEDURES  Procedure(s) performed:   Procedures  None ____________________________________________   INITIAL IMPRESSION / ASSESSMENT AND PLAN / ED COURSE  Pertinent labs & imaging results that were available during my care of the patient were reviewed by me and considered in my medical decision making (see chart for details).    Patient Vitals for the past 24 hrs:  BP Temp Temp src Pulse Resp SpO2 Height Weight  02/01/21 2011 (!) 146/77 -- -- 85 19 95 % -- --  02/01/21 1900 (!) 144/108 -- -- 96 16 95 % -- --  02/01/21 1830 (!) 149/87 -- -- 97 -- 95 % -- --  02/01/21 1814 (!) 152/83 98.5 F (36.9 C) Oral (!) 104 20 94 % -- --  02/01/21 1814 -- -- -- -- -- -- 5\' 3"  (1.6 m) 74.8 kg    Medical Decision Making: Summary:  Patient presents to the ED with mid-epigastric pain and CP. Burning in quality. Vitals significant for HTN and  mild tachycardia on arrival. HR improved with IVF.   Critical Interventions- IVF, Pain/nausea mgmt and CT imaging with CXR; to evaluate  Chief Complaint  Patient presents with   Abdominal Pain   Chest Pain    and assess for illness characterized as Acute, Previously Undiagnosed, Uncertain Prognosis, Complicated,  Systemic Symptoms, and Threat to Life/Bodily Function   After These Interventions, the Patient was reevaluated and was found to be much improved symptomatically. Vitals improved.   This patient is Presenting for Evaluation of CP and abdominal pain, which does require a range of treatment options, and is a complaint that involves a high risk of morbidity and mortality.  The Differential Diagnoses include  Differential diagnosis includes but is not exclusive to acute cholecystitis, intrathoracic causes for epigastric abdominal pain, gastritis, duodenitis, pancreatitis, small bowel or large bowel obstruction, abdominal aortic aneurysm, hernia, gastritis, etc.  Differential includes all life-threatening causes for chest pain. This includes but is not exclusive to acute coronary syndrome, aortic dissection, pulmonary embolism, cardiac tamponade, community-acquired pneumonia, pericarditis, musculoskeletal chest wall pain, etc.    Clinical Laboratory Tests Ordered, included CBC, Metabolic panel, Urinalysis, and Troponin x 2 . Review indicates No AKI, leukocytosis and negative troponin x 2.   Radiologic Tests Ordered, included CT abdomen/pelvis and CXR.  I independently Visualized: CT and CXR Images, which show hiatal hernia. No other acute findings. Nonspecific mesenteric findings similar to prior scans. CXR without infiltrate or PNX.    Cardiac Monitor Tracing which shows no acute findings. No arrhythmia or ischemic change.    Pharmaceutical Risk Management pain and nausea medication Parenteral Treatment    Plan for symptom mgmt at home with meds sent to phaarmacy. Plan for PCP follow up. Discussed strict ED return precautions should symptoms worsen.  ____________________________________________  FINAL CLINICAL IMPRESSION(S) / ED DIAGNOSES  Final diagnoses:  Epigastric pain  Nausea and vomiting, unspecified vomiting type     MEDICATIONS GIVEN DURING THIS VISIT:  Medications  sodium  chloride 0.9 % bolus 500 mL (0 mLs Intravenous Stopped 02/01/21 2000)  pantoprazole (PROTONIX) injection 40 mg (40 mg Intravenous Given 02/01/21 1902)  ondansetron (ZOFRAN) injection 4 mg (4 mg Intravenous Given 02/01/21 1901)  fentaNYL (SUBLIMAZE) injection 50 mcg (50 mcg Intravenous Given 02/01/21 1904)  iohexol (OMNIPAQUE) 300 MG/ML solution 100 mL (100 mLs Intravenous Contrast Given 02/01/21 1912)  alum & mag hydroxide-simeth (MAALOX/MYLANTA) 200-200-20 MG/5ML suspension 15 mL (15 mLs Oral Given 02/01/21 2039)     NEW OUTPATIENT MEDICATIONS STARTED DURING THIS VISIT:  Discharge Medication List as of 02/01/2021  8:35 PM     START taking these medications   Details  loperamide (IMODIUM) 2 MG capsule Take 1 capsule (2 mg total) by mouth 4 (four) times daily as needed for diarrhea or loose stools., Starting Mon 02/01/2021, Normal    pantoprazole (PROTONIX) 40 MG tablet Take 1 tablet (40 mg total) by mouth daily., Starting Mon 02/01/2021, Normal        Note:  This document was prepared using Dragon voice recognition software and may include unintentional dictation errors.  Nanda Quinton, MD, [facility]    Alyx Gee, [facility], MD 02/02/21 1141

## 2021-04-25 ENCOUNTER — Emergency Department (HOSPITAL_BASED_OUTPATIENT_CLINIC_OR_DEPARTMENT_OTHER): Payer: Medicare HMO

## 2021-04-25 ENCOUNTER — Encounter (HOSPITAL_BASED_OUTPATIENT_CLINIC_OR_DEPARTMENT_OTHER): Payer: Self-pay | Admitting: *Deleted

## 2021-04-25 ENCOUNTER — Emergency Department (HOSPITAL_BASED_OUTPATIENT_CLINIC_OR_DEPARTMENT_OTHER)
Admission: EM | Admit: 2021-04-25 | Discharge: 2021-04-25 | Disposition: A | Discharge status: Home / Self Care | Payer: Self-pay | Attending: Emergency Medicine | Admitting: Emergency Medicine

## 2021-04-25 ENCOUNTER — Other Ambulatory Visit: Payer: Self-pay

## 2021-04-25 DIAGNOSIS — R519 Headache, unspecified: Secondary | ICD-10-CM | POA: Insufficient documentation

## 2021-04-25 LAB — CBC WITH DIFFERENTIAL/PLATELET
Abs Immature Granulocytes: 0.02 10*3/uL (ref 0.00–0.07)
Basophils Absolute: 0 10*3/uL (ref 0.0–0.1)
Basophils Relative: 0 %
Eosinophils Absolute: 0.4 10*3/uL (ref 0.0–0.5)
Eosinophils Relative: 4 %
HCT: 41.3 % (ref 36.0–46.0)
Hemoglobin: 13.7 g/dL (ref 12.0–15.0)
Immature Granulocytes: 0 %
Lymphocytes Relative: 22 %
Lymphs Abs: 1.8 10*3/uL (ref 0.7–4.0)
MCH: 29.8 pg (ref 26.0–34.0)
MCHC: 33.2 g/dL (ref 30.0–36.0)
MCV: 89.8 fL (ref 80.0–100.0)
Monocytes Absolute: 0.9 10*3/uL (ref 0.1–1.0)
Monocytes Relative: 11 %
Neutro Abs: 5.2 10*3/uL (ref 1.7–7.7)
Neutrophils Relative %: 63 %
Platelets: 254 10*3/uL (ref 150–400)
RBC: 4.6 MIL/uL (ref 3.87–5.11)
RDW: 13 % (ref 11.5–15.5)
WBC: 8.4 10*3/uL (ref 4.0–10.5)
nRBC: 0 % (ref 0.0–0.2)

## 2021-04-25 LAB — BASIC METABOLIC PANEL
Anion gap: 5 (ref 5–15)
BUN: 18 mg/dL (ref 8–23)
CO2: 27 mmol/L (ref 22–32)
Calcium: 8.4 mg/dL — ABNORMAL LOW (ref 8.9–10.3)
Chloride: 107 mmol/L (ref 98–111)
Creatinine, Ser: 1.07 mg/dL — ABNORMAL HIGH (ref 0.44–1.00)
GFR, Estimated: 58 mL/min — ABNORMAL LOW (ref >60)
Glucose, Bld: 110 mg/dL — ABNORMAL HIGH (ref 70–99)
Potassium: 4 mmol/L (ref 3.5–5.1)
Sodium: 139 mmol/L (ref 135–145)

## 2021-04-25 LAB — TROPONIN I (HIGH SENSITIVITY)
Troponin I (High Sensitivity): 4 ng/L (ref <18)
Troponin I (High Sensitivity): 4 ng/L (ref <18)

## 2021-04-25 MED ORDER — DIPHENHYDRAMINE HCL 50 MG/ML IJ SOLN
12.5000 mg | Freq: Once | INTRAMUSCULAR | Status: AC
Start: 2021-04-25 — End: 2021-04-25
  Administered 2021-04-25: 12.5 mg via INTRAVENOUS
  Filled 2021-04-25: qty 1

## 2021-04-25 MED ORDER — PROCHLORPERAZINE EDISYLATE 10 MG/2ML IJ SOLN
10.0000 mg | Freq: Once | INTRAMUSCULAR | Status: AC
  Administered 2021-04-25: 10 mg via INTRAVENOUS
  Filled 2021-04-25: qty 2

## 2021-04-25 MED ORDER — KETOROLAC TROMETHAMINE 15 MG/ML IJ SOLN
15.0000 mg | Freq: Once | INTRAMUSCULAR | Status: AC
Start: 2021-04-25 — End: 2021-04-25
  Administered 2021-04-25: 15 mg via INTRAVENOUS
  Filled 2021-04-25: qty 1

## 2021-04-25 MED ORDER — SODIUM CHLORIDE 0.9 % IV BOLUS
500.0000 mL | Freq: Once | INTRAVENOUS | Status: AC
  Administered 2021-04-25: 500 mL via INTRAVENOUS

## 2021-04-25 NOTE — ED Triage Notes (Signed)
Pt reports generalized headache since 0500. Denies n/v. Endorses mild photosensitivity. States she took 4 OTC ibuprofen at 0900 and one 800mg  ibuprofen at 0930 without relief ?

## 2021-04-25 NOTE — Discharge Instructions (Addendum)
Follow-up with your primary care doctor.  Take Tylenol or Motrin as needed for pain.  Come back to ER if you develop uncontrolled headache, vomiting, chest pain, difficulty breathing, or other new concerning symptom. ?

## 2021-04-25 NOTE — ED Provider Notes (Signed)
?[facility] EMERGENCY DEPARTMENT ?Provider Note ? ? ?CSN: [id number] ?Arrival date & time: 04/25/21  1513 ? ?  ? ?History ? ?Chief Complaint  ?Patient presents with  ? Headache  ? ? ?Kelsey GongSarah Marquez is a 66 y.o. female.  Presented to the emergency department with concern for headache.  Headache started this morning at about 5:00 when she woke up.  Was not severe initially but got progressively worse over the next few hours, now up to 10 out of 10 in severity.  No improvement with Motrin at home.  Has mild photosensitivity, no loss of vision or change in vision.  No change in speech numbness weakness or tingling.  No neck stiffness or fever.  She denies any major medical problems.  Earlier today she had an episode of feeling short of breath while having an intense pain in head. States this has resolved.  ? ?HPI ? ?  ? ?Home Medications ?Prior to Admission medications   ?Medication Sig Start Date End Date Taking? Authorizing Provider  ?azithromycin (ZITHROMAX) 250 MG tablet 2 tablets today, then 1 tablet for the next 4 days. 06/22/17   Chinita Pesterriplett, Cari B, FNP  ?brompheniramine-pseudoephedrine-DM 30-2-10 MG/5ML syrup Take 5 mLs by mouth 4 (four) times daily as needed. 06/22/17   Chinita Pesterriplett, Cari B, FNP  ?cyclobenzaprine (FLEXERIL) 5 MG tablet Take 1 tablet (5 mg total) by mouth 3 (three) times daily as needed. 09/17/16   Little, Traci M, PA-C  ?loperamide (IMODIUM) 2 MG capsule Take 1 capsule (2 mg total) by mouth 4 (four) times daily as needed for diarrhea or loose stools. 02/01/21   Long, Arlyss RepressJoshua G, MD  ?morphine (MSIR) 30 MG tablet Take 0.5-1 tablets (15-30 mg total) by mouth every 6 (six) hours as needed for severe pain. 07/01/15   Arthor CaptainHarris, Abigail, PA-C  ?naproxen (NAPROSYN) 375 MG tablet Take 1 tablet (375 mg total) by mouth 2 (two) times daily. 07/01/15   Arthor CaptainHarris, Abigail, PA-C  ?ondansetron (ZOFRAN) 4 MG tablet Take 1 tablet (4 mg total) by mouth every 8 (eight) hours as needed for up to 20 doses for nausea or vomiting.  11/30/19   Curatolo, Adam, DO  ?ondansetron (ZOFRAN-ODT) 4 MG disintegrating tablet Take 1 tablet (4 mg total) by mouth every 8 (eight) hours as needed for nausea or vomiting. 02/01/21   Long, Arlyss RepressJoshua G, MD  ?pantoprazole (PROTONIX) 40 MG tablet Take 1 tablet (40 mg total) by mouth daily. 02/01/21   Long, Arlyss RepressJoshua G, MD  ?   ? ?Allergies    ?Patient has no known allergies.   ? ?Review of Systems   ?Review of Systems  ?Constitutional:  Negative for chills and fever.  ?HENT:  Negative for ear pain and sore throat.   ?Eyes:  Negative for pain and visual disturbance.  ?Respiratory:  Negative for cough and shortness of breath.   ?Cardiovascular:  Positive for chest pain. Negative for palpitations.  ?Gastrointestinal:  Negative for abdominal pain and vomiting.  ?Genitourinary:  Negative for dysuria and hematuria.  ?Musculoskeletal:  Negative for arthralgias and back pain.  ?Skin:  Negative for color change and rash.  ?Neurological:  Positive for headaches. Negative for seizures and syncope.  ?All other systems reviewed and are negative. ? ?Physical Exam ?Updated Vital Signs ?BP (!) 121/59   Pulse 71   Temp 98 ?F (36.7 ?C) (Oral)   Resp 18   SpO2 96%  ?Physical Exam ?Vitals and nursing note reviewed.  ?Constitutional:   ?   General: She is  not in acute distress. ?   Appearance: She is well-developed.  ?HENT:  ?   Head: Normocephalic and atraumatic.  ?Eyes:  ?   Conjunctiva/sclera: Conjunctivae normal.  ?Cardiovascular:  ?   Rate and Rhythm: Normal rate and regular rhythm.  ?   Heart sounds: No murmur heard. ?Pulmonary:  ?   Effort: Pulmonary effort is normal. No respiratory distress.  ?   Breath sounds: Normal breath sounds.  ?Abdominal:  ?   Palpations: Abdomen is soft.  ?   Tenderness: There is no abdominal tenderness.  ?Musculoskeletal:     ?   General: No swelling.  ?   Cervical back: Neck supple.  ?Skin: ?   General: Skin is warm and dry.  ?   Capillary Refill: Capillary refill takes less than 2 seconds.  ?Neurological:   ?   Mental Status: She is alert.  ?   Comments: AAOx3 ?CN 2-12 intact, speech clear visual fields intact ?5/5 strength in b/l UE and LE ?Sensation to light touch intact in b/l UE and LE ?Normal FNF ?Normal gait  ?Psychiatric:     ?   Mood and Affect: Mood normal.  ? ? ?ED Results / Procedures / Treatments   ?Labs ?(all labs ordered are listed, but only abnormal results are displayed) ?Labs Reviewed  ?BASIC METABOLIC PANEL - Abnormal; Notable for the following components:  ?    Result Value  ? Glucose, Bld 110 (*)   ? Creatinine, Ser 1.07 (*)   ? Calcium 8.4 (*)   ? GFR, Estimated 58 (*)   ? All other components within normal limits  ?CBC WITH DIFFERENTIAL/PLATELET  ?TROPONIN I (HIGH SENSITIVITY)  ?TROPONIN I (HIGH SENSITIVITY)  ? ? ?EKG ?None ? ?Radiology ?DG Chest 2 View ? ?Result Date: 04/25/2021 ?CLINICAL DATA:  Chest pain beginning this morning. EXAM: CHEST - 2 VIEW COMPARISON:  02/01/2021 FINDINGS: The heart size and mediastinal contours are within normal limits. Both lungs are clear. The visualized skeletal structures are unremarkable. IMPRESSION: No active cardiopulmonary disease. Electronically Signed   By: Danae Orleans M.D.   On: 04/25/2021 15:53  ? ?CT Head Wo Contrast ? ?Result Date: 04/25/2021 ?CLINICAL DATA:  Acute onset of headache and photophobia this morning. EXAM: CT HEAD WITHOUT CONTRAST TECHNIQUE: Contiguous axial images were obtained from the base of the skull through the vertex without intravenous contrast. RADIATION DOSE REDUCTION: This exam was performed according to the departmental dose-optimization program which includes automated exposure control, adjustment of the mA and/or kV according to patient size and/or use of iterative reconstruction technique. COMPARISON:  09/17/2016 FINDINGS: Brain: No evidence of intracranial hemorrhage, acute infarction, hydrocephalus, extra-axial collection, or mass lesion/mass effect. Vascular:  No hyperdense vessel or other acute findings. Skull: No evidence  of fracture or other significant bone abnormality. Sinuses/Orbits: No acute findings. Mucosal thickening again seen involving the ethmoid, maxillary, and sphenoid sinuses bilaterally. Other: None. IMPRESSION: No intracranial abnormality. Chronic paranasal sinus disease. Electronically Signed   By: Danae Orleans M.D.   On: 04/25/2021 15:57   ? ?Procedures ?Procedures  ? ? ?Medications Ordered in ED ?Medications  ?prochlorperazine (COMPAZINE) injection 10 mg (10 mg Intravenous Given 04/25/21 1557)  ?diphenhydrAMINE (BENADRYL) injection 12.5 mg (12.5 mg Intravenous Given 04/25/21 1557)  ?sodium chloride 0.9 % bolus 500 mL (0 mLs Intravenous Stopped 04/25/21 1642)  ?ketorolac (TORADOL) 15 MG/ML injection 15 mg (15 mg Intravenous Given 04/25/21 1641)  ? ? ?ED Course/ Medical Decision Making/ A&P ?  ?                        ?  Medical Decision Making ?Amount and/or Complexity of Data Reviewed ?Labs: ordered. ?Radiology: ordered. ? ?Risk ?Prescription drug management. ? ? ?66 year old lady presenting to the emergency department with concern for headache.  Was not sudden onset but patient does not normally get headaches.  Severe.  CT head obtained, no evidence for [facility], stroke, mass.  I independently reviewed CT imaging.  Basic labs are stable.  Patient was provided headache cocktail and had modest improvement but on reassessment was still having some headache and provided additional dose of pain medicine.  On further reassessment her headache had completely resolved.  Patient also had endorsed episode of shortness of breath earlier in the day.  No ongoing difficulty breathing while in ER, CXR unremarkable.  Vital stable throughout ER stay.  Given she has no ongoing symptoms and had a reassuring work-up, feel she can be discharged home at this time. ? ? ? ?After the discussed management above, the patient was determined to be safe for discharge.  The patient was in agreement with this plan and all questions regarding their care  were answered.  ED return precautions were discussed and the patient will return to the ED with any significant worsening of condition. ? ? ? ? ? ? ? ? ?Final Clinical Impression(s) / ED Diagnoses ?Final di

## 2022-07-21 IMAGING — DX DG CHEST 2V
2 series · 2 of 2 positions shown · non-contrast
Comparison: 02/01/2021

CLINICAL DATA: Chest pain beginning this morning.

EXAM:
CHEST - 2 VIEW

[chest pa]
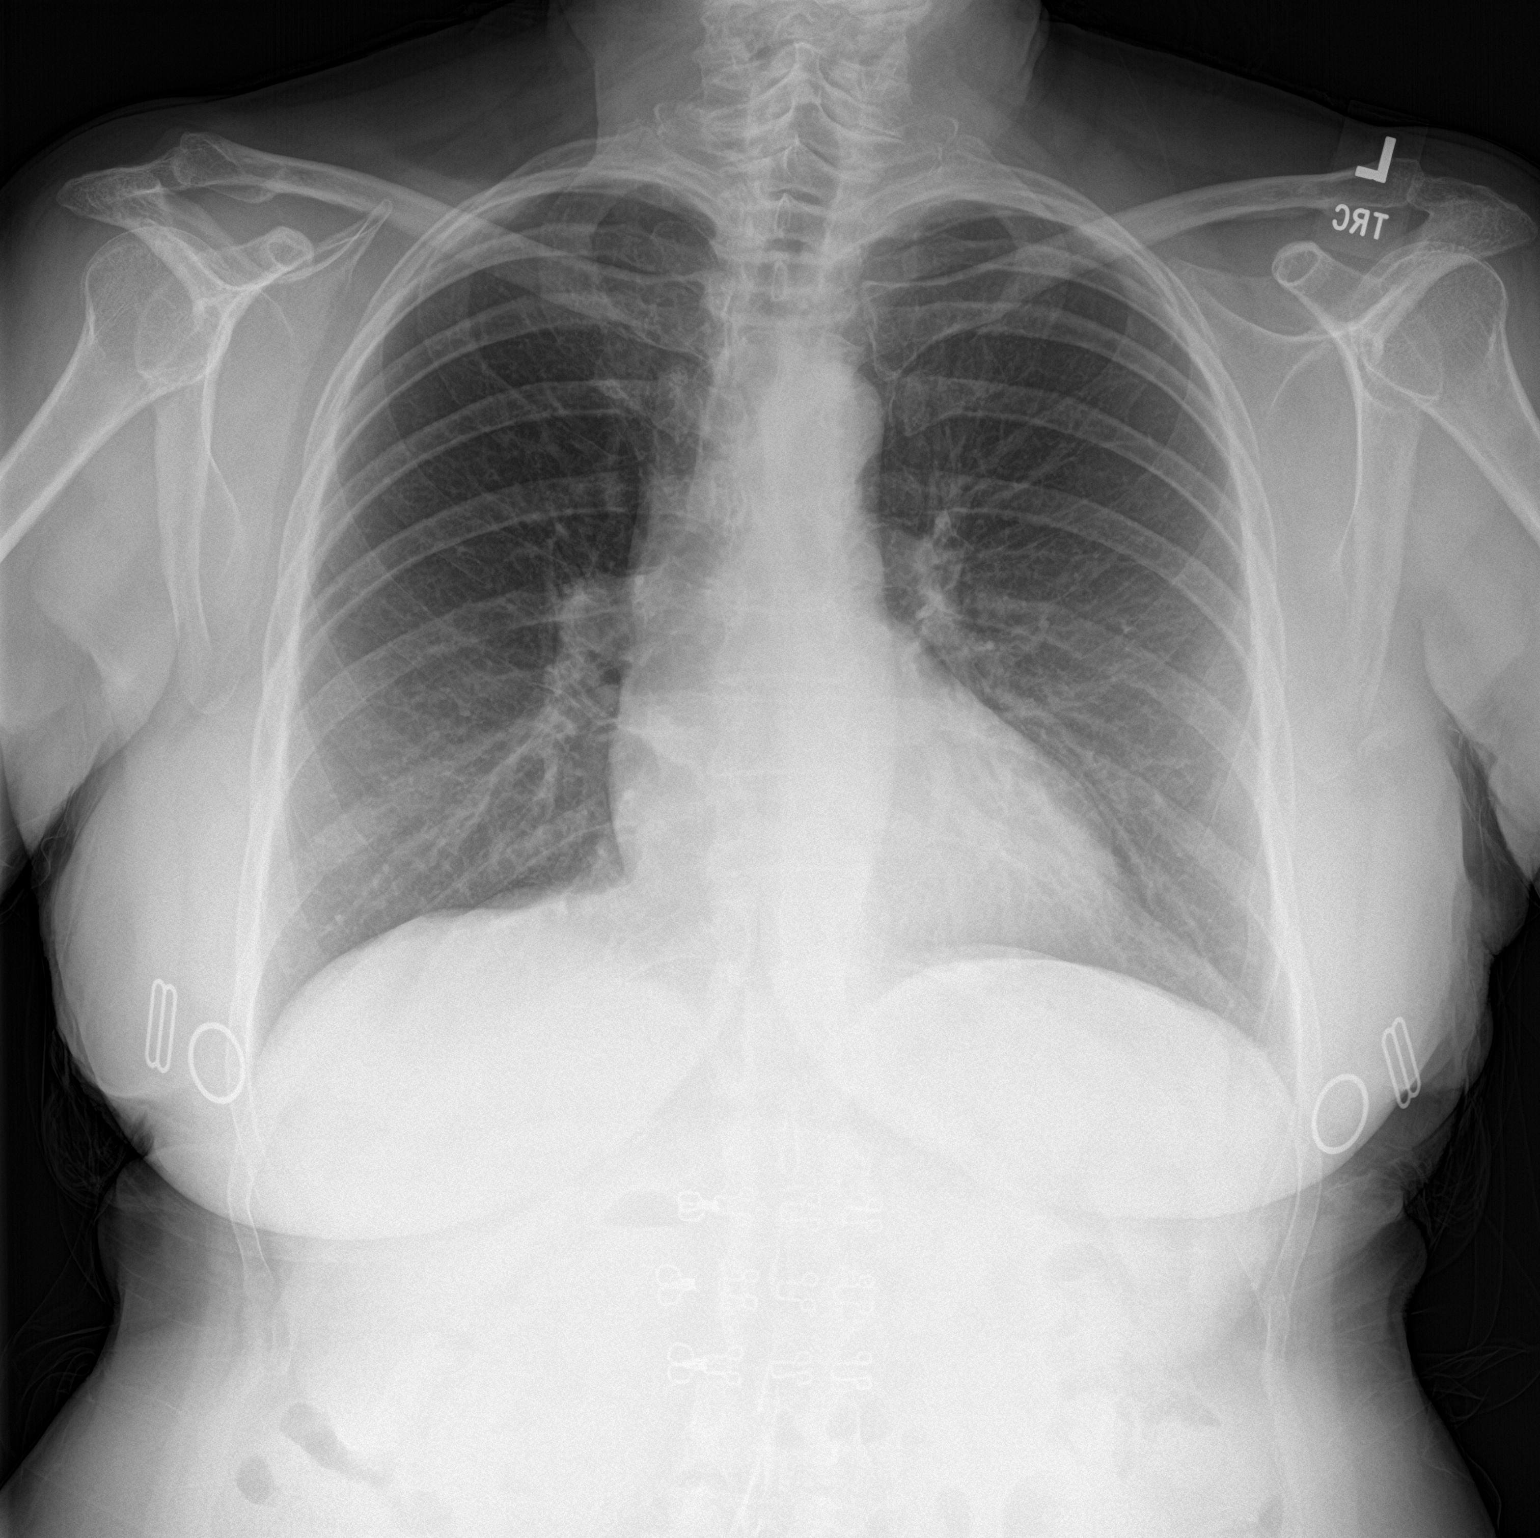

[chest lat]
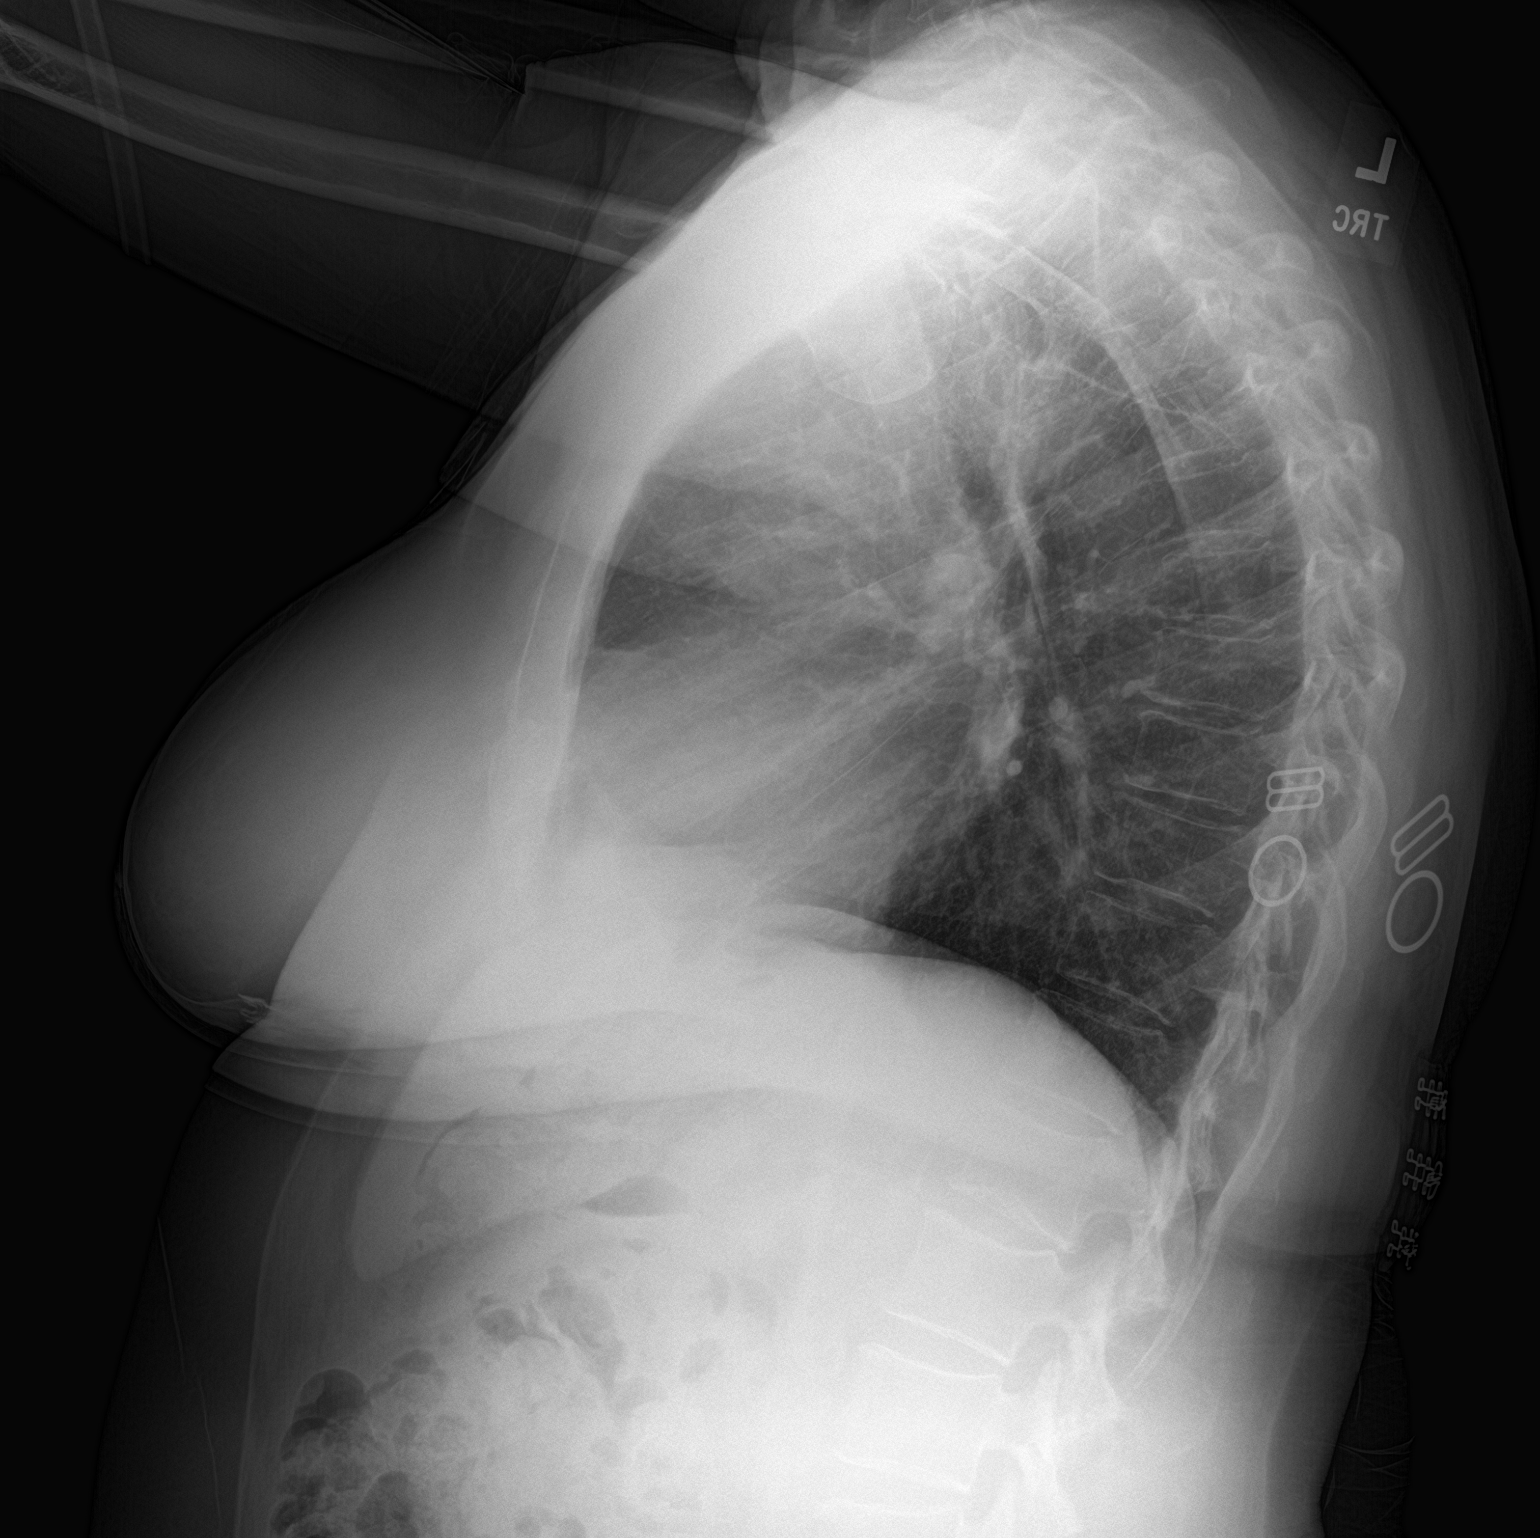

[2 of 2 positions shown; findings below may reference images not displayed]

FINDINGS: The heart size and mediastinal contours are within normal limits.
Both lungs are clear. The visualized skeletal structures are
unremarkable.
IMPRESSION: No active cardiopulmonary disease.

## 2024-03-19 ENCOUNTER — Encounter: Payer: Self-pay | Admitting: Advanced Practice Midwife
